# Patient Record
Sex: Female | Born: 1995 | Race: Black or African American | Hispanic: No | Marital: Single | Smoking: Never smoker
Health system: Southern US, Community
[De-identification: ages and names within clinical notes are randomized; demographics above are authoritative.]

## PROBLEM LIST (undated history)

## (undated) DIAGNOSIS — A749 Chlamydial infection, unspecified: Secondary | ICD-10-CM

## (undated) HISTORY — PX: NO PAST SURGERIES: SHX2092

---
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
## 2017-01-24 ENCOUNTER — Encounter (HOSPITAL_COMMUNITY): Payer: Self-pay | Admitting: Certified Nurse Midwife

## 2017-01-24 ENCOUNTER — Inpatient Hospital Stay (HOSPITAL_COMMUNITY)
Admission: AD | Admit: 2017-01-24 | Discharge: 2017-01-24 | Disposition: A | Discharge status: Home / Self Care | Source: Ambulatory Visit | Attending: Obstetrics and Gynecology | Admitting: Obstetrics and Gynecology

## 2017-01-24 DIAGNOSIS — Z3A38 38 weeks gestation of pregnancy: Secondary | ICD-10-CM | POA: Diagnosis not present

## 2017-01-24 DIAGNOSIS — O471 False labor at or after 37 completed weeks of gestation: Secondary | ICD-10-CM | POA: Diagnosis not present

## 2017-01-24 HISTORY — DX: Chlamydial infection, unspecified: A74.9

## 2017-01-24 NOTE — MAU Note (Signed)
Patient presents to MAU with c/o of contractions. States she does not know when contractions began, "did not look at time", states she is having them every 6-7 minutes with a pain of 5/10. Denies LOF, VB. +FM. Closed in office 2 weeks ago.

## 2017-02-06 ENCOUNTER — Encounter (HOSPITAL_COMMUNITY): Payer: Self-pay

## 2017-02-06 ENCOUNTER — Telehealth (HOSPITAL_COMMUNITY): Payer: Self-pay | Admitting: *Deleted

## 2017-02-06 ENCOUNTER — Inpatient Hospital Stay (HOSPITAL_COMMUNITY)
Admission: AD | Admit: 2017-02-06 | Discharge: 2017-02-06 | Disposition: A | Discharge status: Home / Self Care | Source: Ambulatory Visit | Attending: Obstetrics & Gynecology | Admitting: Obstetrics & Gynecology

## 2017-02-06 DIAGNOSIS — Z3A39 39 weeks gestation of pregnancy: Secondary | ICD-10-CM | POA: Diagnosis not present

## 2017-02-06 DIAGNOSIS — R102 Pelvic and perineal pain: Secondary | ICD-10-CM | POA: Diagnosis present

## 2017-02-06 DIAGNOSIS — O471 False labor at or after 37 completed weeks of gestation: Secondary | ICD-10-CM | POA: Diagnosis not present

## 2017-02-06 LAB — POCT FERN TEST: POCT Fern Test: NEGATIVE

## 2017-02-06 NOTE — Telephone Encounter (Signed)
Preadmission screen  

## 2017-02-06 NOTE — Discharge Instructions (Signed)
Braxton Hicks Contractions °Contractions of the uterus can occur throughout pregnancy, but they are not always a sign that you are in labor. You may have practice contractions called Braxton Hicks contractions. These false labor contractions are sometimes confused with true labor. °What are Braxton Hicks contractions? °Braxton Hicks contractions are tightening movements that occur in the muscles of the uterus before labor. Unlike true labor contractions, these contractions do not result in opening (dilation) and thinning of the cervix. Toward the end of pregnancy (32-34 weeks), Braxton Hicks contractions can happen more often and may become stronger. These contractions are sometimes difficult to tell apart from true labor because they can be very uncomfortable. You should not feel embarrassed if you go to the hospital with false labor. °Sometimes, the only way to tell if you are in true labor is for your health care provider to look for changes in the cervix. The health care provider will do a physical exam and may monitor your contractions. If you are not in true labor, the exam should show that your cervix is not dilating and your water has not broken. °If there are no prenatal problems or other health problems associated with your pregnancy, it is completely safe for you to be sent home with false labor. You may continue to have Braxton Hicks contractions until you go into true labor. °How can I tell the difference between true labor and false labor? °· Differences °? False labor °? Contractions last 30-70 seconds.: Contractions are usually shorter and not as strong as true labor contractions. °? Contractions become very regular.: Contractions are usually irregular. °? Discomfort is usually felt in the top of the uterus, and it spreads to the lower abdomen and low back.: Contractions are often felt in the front of the lower abdomen and in the groin. °? Contractions do not go away with walking.: Contractions may  go away when you walk around or change positions while lying down. °? Contractions usually become more intense and increase in frequency.: Contractions get weaker and are shorter-lasting as time goes on. °? The cervix dilates and gets thinner.: The cervix usually does not dilate or become thin. °Follow these instructions at home: °· Take over-the-counter and prescription medicines only as told by your health care provider. °· Keep up with your usual exercises and follow other instructions from your health care provider. °· Eat and drink lightly if you think you are going into labor. °· If Braxton Hicks contractions are making you uncomfortable: °? Change your position from lying down or resting to walking, or change from walking to resting. °? Sit and rest in a tub of warm water. °? Drink enough fluid to keep your urine clear or pale yellow. Dehydration may cause these contractions. °? Do slow and deep breathing several times an hour. °· Keep all follow-up prenatal visits as told by your health care provider. This is important. °Contact a health care provider if: °· You have a fever. °· You have continuous pain in your abdomen. °Get help right away if: °· Your contractions become stronger, more regular, and closer together. °· You have fluid leaking or gushing from your vagina. °· You pass blood-tinged mucus (bloody show). °· You have bleeding from your vagina. °· You have low back pain that you never had before. °· You feel your baby’s head pushing down and causing pelvic pressure. °· Your baby is not moving inside you as much as it used to. °Summary °· Contractions that occur before labor are   called Braxton Hicks contractions, false labor, or practice contractions.  Braxton Hicks contractions are usually shorter, weaker, farther apart, and less regular than true labor contractions. True labor contractions usually become progressively stronger and regular and they become more frequent.  Manage discomfort from  [facility] System[facility] contractions by changing position, resting in a warm bath, drinking plenty of water, or practicing deep breathing. This information is not intended to replace advice given to you by your health care provider. Make sure you discuss any questions you have with your health care provider. Document Released: 06/23/2005 Document Revised: 05/12/2016 Document Reviewed: 05/12/2016 Elsevier Interactive Patient Education  2017 [employer]  Third Trimester of Pregnancy The third trimester is from week 29 through week 42, months 7 through 9. This trimester is when your unborn baby (fetus) is growing very fast. At the end of the ninth month, the unborn baby is about 20 inches in length. It weighs about 6-10 pounds. Follow these instructions at home:  Avoid all smoking, herbs, and alcohol. Avoid drugs not approved by your doctor.  Do not use any tobacco products, including cigarettes, chewing tobacco, and electronic cigarettes. If you need help quitting, ask your doctor. You may get counseling or other support to help you quit.  Only take medicine as told by your doctor. Some medicines are safe and some are not during pregnancy.  Exercise only as told by your doctor. Stop exercising if you start having cramps.  Eat regular, healthy meals.  Wear a good support bra if your breasts are tender.  Do not use hot tubs, steam rooms, or saunas.  Wear your seat belt when driving.  Avoid raw meat, uncooked cheese, and liter boxes and soil used by cats.  Take your prenatal vitamins.  Take 1500-2000 milligrams of calcium daily starting at the 20th week of pregnancy until you deliver your baby.  Try taking medicine that helps you poop (stool softener) as needed, and if your doctor approves. Eat more fiber by eating fresh fruit, vegetables, and whole grains. Drink enough fluids to keep your pee (urine) clear or pale yellow.  Take warm water baths (sitz baths) to soothe pain or discomfort caused  by hemorrhoids. Use hemorrhoid cream if your doctor approves.  If you have puffy, bulging veins (varicose veins), wear support hose. Raise (elevate) your feet for 15 minutes, 3-4 times a day. Limit salt in your diet.  Avoid heavy lifting, wear low heels, and sit up straight.  Rest with your legs raised if you have leg cramps or low back pain.  Visit your dentist if you have not gone during your pregnancy. Use a soft toothbrush to brush your teeth. Be gentle when you floss.  You can have sex (intercourse) unless your doctor tells you not to.  Do not travel far distances unless you must. Only do so with your doctor's approval.  Take prenatal classes.  Practice driving to the hospital.  Pack your hospital bag.  Prepare the baby's room.  Go to your doctor visits. Get help if:  You are not sure if you are in labor or if your water has broken.  You are dizzy.  You have mild cramps or pressure in your lower belly (abdominal).  You have a nagging pain in your belly area.  You continue to feel sick to your stomach (nauseous), throw up (vomit), or have watery poop (diarrhea).  You have bad smelling fluid coming from your vagina.  You have pain with peeing (urination). Get help right away  if:  You have a fever.  You are leaking fluid from your vagina.  You are spotting or bleeding from your vagina.  You have severe belly cramping or pain.  You lose or gain weight rapidly.  You have trouble catching your breath and have chest pain.  You notice sudden or extreme puffiness (swelling) of your face, hands, ankles, feet, or legs.  You have not felt the baby move in over an hour.  You have severe headaches that do not go away with medicine.  You have vision changes. This information is not intended to replace advice given to you by your health care provider. Make sure you discuss any questions you have with your health care provider. Document Released: 09/17/2009 Document  Revised: 11/29/2015 Document Reviewed: 08/24/2012 Elsevier Interactive Patient Education  2017 [employer] Inc.  Fetal Movement Counts Patient Name: ________________________________________________ Patient Due Date: ____________________ What is a fetal movement count? A fetal movement count is the number of times that you feel your baby move during a certain amount of time. This may also be called a fetal kick count. A fetal movement count is recommended for every pregnant woman. You may be asked to start counting fetal movements as early as week 28 of your pregnancy. Pay attention to when your baby is most active. You may notice your baby's sleep and wake cycles. You may also notice things that make your baby move more. You should do a fetal movement count:  When your baby is normally most active.  At the same time each day.  A good time to count movements is while you are resting, after having something to eat and drink. How do I count fetal movements? 1. Find a quiet, comfortable area. Sit, or lie down on your side. 2. Write down the date, the start time and stop time, and the number of movements that you felt between those two times. Take this information with you to your health care visits. 3. For 2 hours, count kicks, flutters, swishes, rolls, and jabs. You should feel at least 10 movements during 2 hours. 4. You may stop counting after you have felt 10 movements. 5. If you do not feel 10 movements in 2 hours, have something to eat and drink. Then, keep resting and counting for 1 hour. If you feel at least 4 movements during that hour, you may stop counting. Contact a health care provider if:  You feel fewer than 4 movements in 2 hours.  Your baby is not moving like he or she usually does. Date: ____________ Start time: ____________ Stop time: ____________ Movements: ____________ Date: ____________ Start time: ____________ Stop time: ____________ Movements: ____________ Date:  ____________ Start time: ____________ Stop time: ____________ Movements: ____________ Date: ____________ Start time: ____________ Stop time: ____________ Movements: ____________ Date: ____________ Start time: ____________ Stop time: ____________ Movements: ____________ Date: ____________ Start time: ____________ Stop time: ____________ Movements: ____________ Date: ____________ Start time: ____________ Stop time: ____________ Movements: ____________ Date: ____________ Start time: ____________ Stop time: ____________ Movements: ____________ Date: ____________ Start time: ____________ Stop time: ____________ Movements: ____________ This information is not intended to replace advice given to you by your health care provider. Make sure you discuss any questions you have with your health care provider. Document Released: 07/23/2006 Document Revised: 02/20/2016 Document Reviewed: 08/02/2015 Elsevier Interactive Patient Education  [employer]2018 [employer]

## 2017-02-06 NOTE — MAU Note (Signed)
Urine in the lab  

## 2017-02-06 NOTE — MAU Note (Signed)
I have communicated with dr Charlotta Newtonozan and reviewed vital signs:  Vitals:   02/06/17 2050 02/06/17 2119  BP: 125/71 115/70  Pulse: 72 74  Resp: 20 18  Temp: 98.5 F (36.9 C)     Vaginal exam:  Dilation: 1.5 Exam by:: Dr Charlotta Newtonzan,   Also reviewed contraction pattern and that non-stress test is reactive.  It has been documented that patient is contracting every 6 minutes to irregular with minimal cervical change since last exam in office Thursday which was 1 cm there, not indicating active labor.  Patient denies any other complaints.  Based on this report provider has given order for discharge.  A discharge order and diagnosis entered by a provider.  Dr Charlotta Newtonzan here and assessed pt.  Labor discharge instructions reviewed with patient.

## 2017-02-06 NOTE — MAU Note (Signed)
Having pain in vagina for 2 days. Worse with walking. Denies vag bleeding. Some brown mucous d/c. Cervix checked Thurs and 1cm

## 2017-02-06 NOTE — H&P (Signed)
HPI: 21 y/o G1P0 @ 651w4d estimated gestational age (as dated by LMP c/w 20 week ultrasound) presents complaining of vaginal pressure.  She does not one episode of leaking fluid, but states she think she had to urinate.  No leakage since that time.  no Vaginal Bleeding,   no Uterine Contractions,  + Fetal Movement.  ROS: no HA, no epigastric pain, no visual changes.    Pregnancy complicated by: 1) Chlamydia, treated, test of cure neg  PNL:  GBS neg, Rub Immune, Hep B neg, RPR NR, HIV neg, GC/C neg, glucola:67 Hgb: 11.2, plt 135 Blood type: A positive, antibody neg  Immunizations: Tdap: 6/1  OBHx: primip PMHx:  non Meds:  PNV Allergy:   Allergies  Allergen Reactions  . Rocephin [Ceftriaxone]    SurgHx: none SocHx:   no Tobacco, no  EtOH, no Illicit Drugs  O: BP 115/70   Pulse 74   Temp 98.5 F (36.9 C)   Resp 18   Ht 5\' 4"  (1.626 m)   Wt 159 lb (72.1 kg)   LMP 05/05/2016   BMI 27.29 kg/m  Gen. AAOx3, NAD CV.  RRR   Resp. Normal respiratory effort Abd. Gravid,  no tenderness,  no rigidity,  no guarding Extr.  no edema B/L , no calf tenderness FHT: 130 baseline, moderate variability, + accels,  no decels Toco: irregular SVE: 1/25/-3  SSE: No vulvar/vaginal/cervical lesions.  Cervix appears about 1cm in dilation, membranes seen.  Negative pooling, negative ferning  Labs: see orders  A/P:  21 y.o. G1P0 @ 2651w4d EGA who presents for false labor -FWB:  NICHD Cat I FHTs -Labor: Minimal cervical dilation noted.  Reviewed labor room precautions and fetal kick counts.  F/U as scheduled in office -GBS: neg  Myna HidalgoJennifer Osmara Drummonds, DO [phone]) [phone] (office)

## 2017-02-10 ENCOUNTER — Encounter (HOSPITAL_COMMUNITY): Payer: Self-pay | Admitting: *Deleted

## 2017-02-10 ENCOUNTER — Inpatient Hospital Stay (HOSPITAL_COMMUNITY)
Admission: AD | Admit: 2017-02-10 | Discharge: 2017-02-10 | Disposition: A | Discharge status: Home / Self Care | Source: Ambulatory Visit | Attending: Obstetrics and Gynecology | Admitting: Obstetrics and Gynecology

## 2017-02-10 DIAGNOSIS — O479 False labor, unspecified: Secondary | ICD-10-CM

## 2017-02-10 NOTE — Discharge Instructions (Signed)
Third Trimester of Pregnancy The third trimester is from week 28 through week 40 (months 7 through 9). The third trimester is a time when the unborn baby (fetus) is growing rapidly. At the end of the ninth month, the fetus is about 20 inches in length and weighs 6-10 pounds. Body changes during your third trimester Your body will continue to go through many changes during pregnancy. The changes vary from woman to woman. During the third trimester:  Your weight will continue to increase. You can expect to gain 25-35 pounds (11-16 kg) by the end of the pregnancy.  You may begin to get stretch marks on your hips, abdomen, and breasts.  You may urinate more often because the fetus is moving lower into your pelvis and pressing on your bladder.  You may develop or continue to have heartburn. This is caused by increased hormones that slow down muscles in the digestive tract.  You may develop or continue to have constipation because increased hormones slow digestion and cause the muscles that push waste through your intestines to relax.  You may develop hemorrhoids. These are swollen veins (varicose veins) in the rectum that can itch or be painful.  You may develop swollen, bulging veins (varicose veins) in your legs.  You may have increased body aches in the pelvis, back, or thighs. This is due to weight gain and increased hormones that are relaxing your joints.  You may have changes in your hair. These can include thickening of your hair, rapid growth, and changes in texture. Some women also have hair loss during or after pregnancy, or hair that feels dry or thin. Your hair will most likely return to normal after your baby is born.  Your breasts will continue to grow and they will continue to become tender. A yellow fluid (colostrum) may leak from your breasts. This is the first milk you are producing for your baby.  Your belly button may stick out.  You may notice more swelling in your hands,  face, or ankles.  You may have increased tingling or numbness in your hands, arms, and legs. The skin on your belly may also feel numb.  You may feel short of breath because of your expanding uterus.  You may have more problems sleeping. This can be caused by the size of your belly, increased need to urinate, and an increase in your body's metabolism.  You may notice the fetus "dropping," or moving lower in your abdomen (lightening).  You may have increased vaginal discharge.  You may notice your joints feel loose and you may have pain around your pelvic bone.  What to expect at prenatal visits You will have prenatal exams every 2 weeks until week 36. Then you will have weekly prenatal exams. During a routine prenatal visit:  You will be weighed to make sure you and the baby are growing normally.  Your blood pressure will be taken.  Your abdomen will be measured to track your baby's growth.  The fetal heartbeat will be listened to.  Any test results from the previous visit will be discussed.  You may have a cervical check near your due date to see if your cervix has softened or thinned (effaced).  You will be tested for Group B streptococcus. This happens between 35 and 37 weeks.  Your health care provider may ask you:  What your birth plan is.  How you are feeling.  If you are feeling the baby move.  If you have had   any abnormal symptoms, such as leaking fluid, bleeding, severe headaches, or abdominal cramping.  If you are using any tobacco products, including cigarettes, chewing tobacco, and electronic cigarettes.  If you have any questions.  Other tests or screenings that may be performed during your third trimester include:  Blood tests that check for low iron levels (anemia).  Fetal testing to check the health, activity level, and growth of the fetus. Testing is done if you have certain medical conditions or if there are problems during the  pregnancy.  Nonstress test (NST). This test checks the health of your baby to make sure there are no signs of problems, such as the baby not getting enough oxygen. During this test, a belt is placed around your belly. The baby is made to move, and its heart rate is monitored during movement.  What is false labor? False labor is a condition in which you feel small, irregular tightenings of the muscles in the womb (contractions) that usually go away with rest, changing position, or drinking water. These are called Braxton Hicks contractions. Contractions may last for hours, days, or even weeks before true labor sets in. If contractions come at regular intervals, become more frequent, increase in intensity, or become painful, you should see your health care provider. What are the signs of labor?  Abdominal cramps.  Regular contractions that start at 10 minutes apart and become stronger and more frequent with time.  Contractions that start on the top of the uterus and spread down to the lower abdomen and back.  Increased pelvic pressure and dull back pain.  A watery or bloody mucus discharge that comes from the vagina.  Leaking of amniotic fluid. This is also known as your "water breaking." It could be a slow trickle or a gush. Let your health care provider know if it has a color or strange odor. If you have any of these signs, call your health care provider right away, even if it is before your due date. Follow these instructions at home: Medicines  Follow your health care provider's instructions regarding medicine use. Specific medicines may be either safe or unsafe to take during pregnancy.  Take a prenatal vitamin that contains at least 600 micrograms (mcg) of folic acid.  If you develop constipation, try taking a stool softener if your health care provider approves. Eating and drinking  Eat a balanced diet that includes fresh fruits and vegetables, whole grains, good sources of protein  such as meat, eggs, or tofu, and low-fat dairy. Your health care provider will help you determine the amount of weight gain that is right for you.  Avoid raw meat and uncooked cheese. These carry germs that can cause birth defects in the baby.  If you have low calcium intake from food, talk to your health care provider about whether you should take a daily calcium supplement.  Eat four or five small meals rather than three large meals a day.  Limit foods that are high in fat and processed sugars, such as fried and sweet foods.  To prevent constipation: ? Drink enough fluid to keep your urine clear or pale yellow. ? Eat foods that are high in fiber, such as fresh fruits and vegetables, whole grains, and beans. Activity  Exercise only as directed by your health care provider. Most women can continue their usual exercise routine during pregnancy. Try to exercise for 30 minutes at least 5 days a week. Stop exercising if you experience uterine contractions.  Avoid heavy   lifting.  Do not exercise in extreme heat or humidity, or at high altitudes.  Wear low-heel, comfortable shoes.  Practice good posture.  You may continue to have sex unless your health care provider tells you otherwise. Relieving pain and discomfort  Take frequent breaks and rest with your legs elevated if you have leg cramps or low back pain.  Take warm sitz baths to soothe any pain or discomfort caused by hemorrhoids. Use hemorrhoid cream if your health care provider approves.  Wear a good support bra to prevent discomfort from breast tenderness.  If you develop varicose veins: ? Wear support pantyhose or compression stockings as told by your healthcare provider. ? Elevate your feet for 15 minutes, 3-4 times a day. Prenatal care  Write down your questions. Take them to your prenatal visits.  Keep all your prenatal visits as told by your health care provider. This is important. Safety  Wear your seat belt at  all times when driving.  Make a list of emergency phone numbers, including numbers for family, friends, the hospital, and police and fire departments. General instructions  Avoid cat litter boxes and soil used by cats. These carry germs that can cause birth defects in the baby. If you have a cat, ask someone to clean the litter box for you.  Do not travel far distances unless it is absolutely necessary and only with the approval of your health care provider.  Do not use hot tubs, steam rooms, or saunas.  Do not drink alcohol.  Do not use any products that contain nicotine or tobacco, such as cigarettes and e-cigarettes. If you need help quitting, ask your health care provider.  Do not use any medicinal herbs or unprescribed drugs. These chemicals affect the formation and growth of the baby.  Do not douche or use tampons or scented sanitary pads.  Do not cross your legs for long periods of time.  To prepare for the arrival of your baby: ? Take prenatal classes to understand, practice, and ask questions about labor and delivery. ? Make a trial run to the hospital. ? Visit the hospital and tour the maternity area. ? Arrange for maternity or paternity leave through employers. ? Arrange for family and friends to take care of pets while you are in the hospital. ? Purchase a rear-facing car seat and make sure you know how to install it in your car. ? Pack your hospital bag. ? Prepare the baby's nursery. Make sure to remove all pillows and stuffed animals from the baby's crib to prevent suffocation.  Visit your dentist if you have not gone during your pregnancy. Use a soft toothbrush to brush your teeth and be gentle when you floss. Contact a health care provider if:  You are unsure if you are in labor or if your water has broken.  You become dizzy.  You have mild pelvic cramps, pelvic pressure, or nagging pain in your abdominal area.  You have lower back pain.  You have persistent  nausea, vomiting, or diarrhea.  You have an unusual or bad smelling vaginal discharge.  You have pain when you urinate. Get help right away if:  Your water breaks before 37 weeks.  You have regular contractions less than 5 minutes apart before 37 weeks.  You have a fever.  You are leaking fluid from your vagina.  You have spotting or bleeding from your vagina.  You have severe abdominal pain or cramping.  You have rapid weight loss or weight gain.    You have shortness of breath with chest pain.  You notice sudden or extreme swelling of your face, hands, ankles, feet, or legs.  Your baby makes fewer than 10 movements in 2 hours.  You have severe headaches that do not go away when you take medicine.  You have vision changes. Summary  The third trimester is from week 28 through week 40, months 7 through 9. The third trimester is a time when the unborn baby (fetus) is growing rapidly.  During the third trimester, your discomfort may increase as you and your baby continue to gain weight. You may have abdominal, leg, and back pain, sleeping problems, and an increased need to urinate.  During the third trimester your breasts will keep growing and they will continue to become tender. A yellow fluid (colostrum) may leak from your breasts. This is the first milk you are producing for your baby.  False labor is a condition in which you feel small, irregular tightenings of the muscles in the womb (contractions) that eventually go away. These are called Braxton Hicks contractions. Contractions may last for hours, days, or even weeks before true labor sets in.  Signs of labor can include: abdominal cramps; regular contractions that start at 10 minutes apart and become stronger and more frequent with time; watery or bloody mucus discharge that comes from the vagina; increased pelvic pressure and dull back pain; and leaking of amniotic fluid. This information is not intended to replace advice  given to you by your health care provider. Make sure you discuss any questions you have with your health care provider. Document Released: 06/17/2001 Document Revised: 11/29/2015 Document Reviewed: 08/24/2012 Elsevier Interactive Patient Education  2017 [employer] Fetal Movement Counts Patient Name: ________________________________________________ Patient Due Date: ____________________ What is a fetal movement count? A fetal movement count is the number of times that you feel your baby move during a certain amount of time. This may also be called a fetal kick count. A fetal movement count is recommended for every pregnant woman. You may be asked to start counting fetal movements as early as week 28 of your pregnancy. Pay attention to when your baby is most active. You may notice your baby's sleep and wake cycles. You may also notice things that make your baby move more. You should do a fetal movement count:  When your baby is normally most active.  At the same time each day.  A good time to count movements is while you are resting, after having something to eat and drink. How do I count fetal movements? 1. Find a quiet, comfortable area. Sit, or lie down on your side. 2. Write down the date, the start time and stop time, and the number of movements that you felt between those two times. Take this information with you to your health care visits. 3. For 2 hours, count kicks, flutters, swishes, rolls, and jabs. You should feel at least 10 movements during 2 hours. 4. You may stop counting after you have felt 10 movements. 5. If you do not feel 10 movements in 2 hours, have something to eat and drink. Then, keep resting and counting for 1 hour. If you feel at least 4 movements during that hour, you may stop counting. Contact a health care provider if:  You feel fewer than 4 movements in 2 hours.  Your baby is not moving like he or she usually does. Date: ____________ Start time: ____________  Stop time: ____________ Movements: ____________ Date: ____________ Start time:   ____________ Stop time: ____________ Movements: ____________ Date: ____________ Start time: ____________ Stop time: ____________ Movements: ____________ Date: ____________ Start time: ____________ Stop time: ____________ Movements: ____________ Date: ____________ Start time: ____________ Stop time: ____________ Movements: ____________ Date: ____________ Start time: ____________ Stop time: ____________ Movements: ____________ Date: ____________ Start time: ____________ Stop time: ____________ Movements: ____________ Date: ____________ Start time: ____________ Stop time: ____________ Movements: ____________ Date: ____________ Start time: ____________ Stop time: ____________ Movements: ____________ This information is not intended to replace advice given to you by your health care provider. Make sure you discuss any questions you have with your health care provider. Document Released: 07/23/2006 Document Revised: 02/20/2016 Document Reviewed: 08/02/2015 Elsevier Interactive Patient Education  2018 [employer] Inc[employer]  

## 2017-02-10 NOTE — MAU Note (Signed)
Patient reports vaginal pain Constant Rating pain 6-7/10 States was seen a couple days ago for the same pain  +braxhton hicks contractions at times per patient  +constipation States able to go but that it hurts and isnt a lot  Denies LOF or VB  +FM

## 2017-02-10 NOTE — MAU Note (Signed)
I have communicated with Dr Richardson Doppole and reviewed vital signs:  Vitals:   02/10/17 1026  BP: 135/80  Pulse: 81  Resp: 18  Temp: (!) 97.5 F (36.4 C)    Vaginal exam:  Dilation: 1.5 Effacement (%): 50 Station: -3 Presentation: Undeterminable Exam by:: Taelor Moncada rn,   Also reviewed contraction pattern and that non-stress test is reactive.  It has been documented that patient is having irregular contractions not indicating active labor.  Patient denies any other complaints.  Based on this report provider has given order for discharge.  A discharge order and diagnosis entered by a provider.   Labor discharge instructions reviewed with patient.

## 2017-02-13 ENCOUNTER — Inpatient Hospital Stay (HOSPITAL_COMMUNITY): Admitting: Anesthesiology

## 2017-02-13 ENCOUNTER — Encounter (HOSPITAL_COMMUNITY): Payer: Self-pay | Admitting: *Deleted

## 2017-02-13 ENCOUNTER — Inpatient Hospital Stay (HOSPITAL_COMMUNITY)
Admission: AD | Admit: 2017-02-13 | Discharge: 2017-02-16 | DRG: 775 | Disposition: A | Discharge status: Home / Self Care | Source: Ambulatory Visit | Attending: Obstetrics & Gynecology | Admitting: Obstetrics & Gynecology

## 2017-02-13 DIAGNOSIS — Z3A40 40 weeks gestation of pregnancy: Secondary | ICD-10-CM | POA: Diagnosis not present

## 2017-02-13 DIAGNOSIS — Z3493 Encounter for supervision of normal pregnancy, unspecified, third trimester: Secondary | ICD-10-CM | POA: Diagnosis present

## 2017-02-13 DIAGNOSIS — D696 Thrombocytopenia, unspecified: Secondary | ICD-10-CM | POA: Diagnosis present

## 2017-02-13 DIAGNOSIS — O9912 Other diseases of the blood and blood-forming organs and certain disorders involving the immune mechanism complicating childbirth: Principal | ICD-10-CM | POA: Diagnosis present

## 2017-02-13 LAB — CBC
HEMATOCRIT: 32.3 % — AB (ref 36.0–46.0)
HEMOGLOBIN: 10.9 g/dL — AB (ref 12.0–15.0)
MCH: 29.1 pg (ref 26.0–34.0)
MCHC: 33.7 g/dL (ref 30.0–36.0)
MCV: 86.1 fL (ref 78.0–100.0)
Platelets: 132 10*3/uL — ABNORMAL LOW (ref 150–400)
RBC: 3.75 MIL/uL — ABNORMAL LOW (ref 3.87–5.11)
RDW: 15 % (ref 11.5–15.5)
WBC: 12.7 10*3/uL — AB (ref 4.0–10.5)

## 2017-02-13 LAB — COMPREHENSIVE METABOLIC PANEL
ALT: 12 U/L — ABNORMAL LOW (ref 14–54)
AST: 17 U/L (ref 15–41)
Albumin: 3.3 g/dL — ABNORMAL LOW (ref 3.5–5.0)
Alkaline Phosphatase: 162 U/L — ABNORMAL HIGH (ref 38–126)
Anion gap: 10 (ref 5–15)
BUN: 7 mg/dL (ref 6–20)
CHLORIDE: 105 mmol/L (ref 101–111)
CO2: 19 mmol/L — ABNORMAL LOW (ref 22–32)
CREATININE: 0.54 mg/dL (ref 0.44–1.00)
Calcium: 9.4 mg/dL (ref 8.9–10.3)
Glucose, Bld: 75 mg/dL (ref 65–99)
POTASSIUM: 4 mmol/L (ref 3.5–5.1)
Sodium: 134 mmol/L — ABNORMAL LOW (ref 135–145)
Total Bilirubin: 0.6 mg/dL (ref 0.3–1.2)
Total Protein: 7 g/dL (ref 6.5–8.1)

## 2017-02-13 LAB — PROTEIN / CREATININE RATIO, URINE
CREATININE, URINE: 139 mg/dL
Protein Creatinine Ratio: 0.21 mg/mg{Cre} — ABNORMAL HIGH (ref 0.00–0.15)
TOTAL PROTEIN, URINE: 29 mg/dL

## 2017-02-13 LAB — TYPE AND SCREEN
ABO/RH(D): A POS
ANTIBODY SCREEN: NEGATIVE

## 2017-02-13 LAB — ABO/RH: ABO/RH(D): A POS

## 2017-02-13 LAB — POCT FERN TEST: POCT Fern Test: POSITIVE

## 2017-02-13 MED ORDER — LACTATED RINGERS IV SOLN
INTRAVENOUS | Status: DC
  Administered 2017-02-13 (×2): via INTRAVENOUS

## 2017-02-13 MED ORDER — OXYTOCIN BOLUS FROM INFUSION
500.0000 mL | Freq: Once | INTRAVENOUS | Status: AC
  Administered 2017-02-14: 500 mL via INTRAVENOUS

## 2017-02-13 MED ORDER — BUTORPHANOL TARTRATE 1 MG/ML IJ SOLN: 1.0000 mg | INTRAMUSCULAR | Status: DC | PRN

## 2017-02-13 MED ORDER — LIDOCAINE HCL (PF) 1 % IJ SOLN
INTRAMUSCULAR | Status: DC | PRN
  Administered 2017-02-13 (×2): 4 mL via EPIDURAL

## 2017-02-13 MED ORDER — PHENYLEPHRINE 40 MCG/ML (10ML) SYRINGE FOR IV PUSH (FOR BLOOD PRESSURE SUPPORT)
80.0000 ug | PREFILLED_SYRINGE | INTRAVENOUS | Status: DC | PRN
  Filled 2017-02-13: qty 5
  Filled 2017-02-13: qty 10

## 2017-02-13 MED ORDER — LACTATED RINGERS IV SOLN
500.0000 mL | INTRAVENOUS | Status: DC | PRN
  Administered 2017-02-13 (×2): 500 mL via INTRAVENOUS

## 2017-02-13 MED ORDER — EPHEDRINE 5 MG/ML INJ
10.0000 mg | INTRAVENOUS | Status: DC | PRN
  Filled 2017-02-13: qty 2

## 2017-02-13 MED ORDER — FENTANYL 2.5 MCG/ML BUPIVACAINE 1/10 % EPIDURAL INFUSION (WH - ANES)
14.0000 mL/h | INTRAMUSCULAR | Status: DC | PRN
  Administered 2017-02-13 – 2017-02-14 (×2): 14 mL/h via EPIDURAL
  Filled 2017-02-13 (×2): qty 100

## 2017-02-13 MED ORDER — OXYCODONE-ACETAMINOPHEN 5-325 MG PO TABS: 1.0000 | ORAL_TABLET | ORAL | Status: DC | PRN

## 2017-02-13 MED ORDER — OXYTOCIN 40 UNITS IN LACTATED RINGERS INFUSION - SIMPLE MED
2.5000 [IU]/h | INTRAVENOUS | Status: DC
Start: 2017-02-13 — End: 2017-02-14

## 2017-02-13 MED ORDER — LIDOCAINE HCL (PF) 1 % IJ SOLN
30.0000 mL | INTRAMUSCULAR | Status: DC | PRN
  Filled 2017-02-13: qty 30

## 2017-02-13 MED ORDER — DIPHENHYDRAMINE HCL 50 MG/ML IJ SOLN: 12.5000 mg | INTRAMUSCULAR | Status: DC | PRN

## 2017-02-13 MED ORDER — LACTATED RINGERS IV SOLN
500.0000 mL | Freq: Once | INTRAVENOUS | Status: AC
  Administered 2017-02-13: 500 mL via INTRAVENOUS

## 2017-02-13 MED ORDER — ONDANSETRON HCL 4 MG/2ML IJ SOLN: 4.0000 mg | Freq: Four times a day (QID) | INTRAMUSCULAR | Status: DC | PRN

## 2017-02-13 MED ORDER — OXYTOCIN 40 UNITS IN LACTATED RINGERS INFUSION - SIMPLE MED
1.0000 m[IU]/min | INTRAVENOUS | Status: DC
  Administered 2017-02-13: 2 m[IU]/min via INTRAVENOUS
  Filled 2017-02-13: qty 1000

## 2017-02-13 MED ORDER — ACETAMINOPHEN 325 MG PO TABS: 650.0000 mg | ORAL_TABLET | ORAL | Status: DC | PRN

## 2017-02-13 MED ORDER — TERBUTALINE SULFATE 1 MG/ML IJ SOLN
0.2500 mg | Freq: Once | INTRAMUSCULAR | Status: DC | PRN
  Filled 2017-02-13: qty 1

## 2017-02-13 MED ORDER — PHENYLEPHRINE 40 MCG/ML (10ML) SYRINGE FOR IV PUSH (FOR BLOOD PRESSURE SUPPORT)
80.0000 ug | PREFILLED_SYRINGE | INTRAVENOUS | Status: DC | PRN
  Filled 2017-02-13: qty 5

## 2017-02-13 MED ORDER — SOD CITRATE-CITRIC ACID 500-334 MG/5ML PO SOLN: 30.0000 mL | ORAL | Status: DC | PRN

## 2017-02-13 MED ORDER — OXYCODONE-ACETAMINOPHEN 5-325 MG PO TABS: 2.0000 | ORAL_TABLET | ORAL | Status: DC | PRN

## 2017-02-13 NOTE — H&P (Signed)
21 y/o G1P0 @ 592w0d estimated gestational age (as dated by LMP c/w 20 week ultrasound) presents for scheduled IOL for full term pregnancy.  Some irregular contractions, no VB, no LOF, +FM  [facility] Dr. Charlotta Newtonzan.  ROS: no HA, no epigastric pain, no visual changes.    Pregnancy complicated by: 1) Chlamydia, treated, test of cure neg  PNL:  GBS neg, Rub Immune, Hep B neg, RPR NR, HIV neg, GC/C neg, glucola:67 Hgb: 11.2, plt 135 Blood type: A positive, antibody neg  Immunizations: Tdap: 6/1  OBHx: primip PMHx:  non Meds:  PNV Allergy:       Allergies  Allergen Reactions  . Rocephin [Ceftriaxone]    SurgHx: none SocHx:   no Tobacco, no  EtOH, no Illicit Drugs  O: BP 115/70   Pulse 74   Temp 98.5 F (36.9 C)   Resp 18   Ht 5\' 4"  (1.626 m)   Wt 159 lb (72.1 kg)   LMP 05/05/2016   BMI 27.29 kg/m   Gen. AAOx3, NAD CV.  RRR   Resp. Normal respiratory effort Abd. Gravid,  no tenderness,  no rigidity,  no guarding Extr.  no edema B/L , no calf tenderness  FHT: 130 by doppler SVE: 1-2/25/-3, vertex on exam   Labs: see orders  A/P:  21 y.o. G1P0 @ 722w0d EGA who presents for IOL for full term pregnancy -FWB:  Reassuring by doppler -Labor: plan for Pit per protocol -GBS: neg -Pain management: IV or epidural upon request   Myna HidalgoJennifer Naydelin Ziegler, DO [phone] (pager) [phone] (office)

## 2017-02-13 NOTE — Anesthesia Pain Management Evaluation Note (Signed)
  CRNA Pain Management Visit Note  Patient: Sydney Adams, 21 y.o., female  "Hello I am a [facility] of the anesthesia team at [facility]. We have an anesthesia team available at all times to provide care throughout the hospital, including epidural management and anesthesia for C-section. I don't know your plan for the delivery whether it a natural birth, water birth, IV sedation, nitrous supplementation, doula or epidural, but we want to meet your pain goals."   1.Was your pain managed to your expectations on prior hospitalizations?   No prior hospitalizations  2.What is your expectation for pain management during this hospitalization?     Epidural  3.How can we help you reach that goal? Epidural in place and working well.  Record the patient's initial score and the patient's pain goal.   Pain: 0  Pain Goal: 5 [facility] wants you to be able to say your pain was always managed very well.  Saheed Carrington 02/13/2017

## 2017-02-13 NOTE — Anesthesia Procedure Notes (Signed)
Epidural Patient location during procedure: OB Start time: 02/13/2017 5:00 PM  Staffing Anesthesiologist: Mal AmabileFOSTER, Delando Satter  Preanesthetic Checklist Completed: patient identified, site marked, surgical consent, pre-op evaluation, timeout performed, IV checked, risks and benefits discussed and monitors and equipment checked  Epidural Patient position: sitting Prep: site prepped and draped and DuraPrep Patient monitoring: continuous pulse ox and blood pressure Approach: midline Location: L3-L4 Injection technique: LOR air  Needle:  Needle type: Tuohy  Needle gauge: 17 G Needle length: 9 cm and 9 Needle insertion depth: 4 cm Catheter type: closed end flexible Catheter size: 19 Gauge Catheter at skin depth: 9 cm Test dose: negative and Other  Assessment Events: blood not aspirated, injection not painful, no injection resistance, negative IV test and no paresthesia  Additional Notes Patient identified. Risks and benefits discussed including failed block, incomplete  Pain control, post dural puncture headache, nerve damage, paralysis, blood pressure Changes, nausea, vomiting, reactions to medications-both toxic and allergic and post Partum back pain. All questions were answered. Patient expressed understanding and wished to proceed. Sterile technique was used throughout procedure. Epidural site was Dressed with sterile barrier dressing. No paresthesias, signs of intravascular injection Or signs of intrathecal spread were encountered.  Patient was more comfortable after the epidural was dosed. Please see RN's note for documentation of vital signs and FHR which are stable.

## 2017-02-13 NOTE — H&P (Signed)
21y/o G1P0@40w4dd  estimated gestational age (as dated by LMP c/w 20week ultrasound) presents for painful contractions and rupture of fluid around 11:30am.  No VB, +FM  [facility]. Charlotta Newtonzan.  ROS: noHA, noepigastric pain, novisual changes.   Pregnancy complicated by: 1) Chlamydia, treated, test of cure neg  PNL: GBS neg, Rub Immune, Hep B neg, RPR NR, HIV neg, GC/C neg, glucola:67 Hgb: 11.2, plt 135 Blood type: A positive, antibody neg  Immunizations: Tdap: 6/1  OBHx:primip PMHx: non Meds: PNV Allergy:      Allergies  Allergen Reactions  . Rocephin [Ceftriaxone]    SurgHx:none SocHx: noTobacco, noEtOH, noIllicit Drugs  O: BP 115/70  Pulse 74  Temp 98.5 F (36.9 C)  Resp 18  Ht 5\' 4"  (1.626 m)  Wt 159 lb (72.1 kg)  LMP 05/05/2016  BMI 27.29 kg/m   Gen. AAOx3, NAD CV. RRR  Resp. Normal respiratory effort Abd.Gravid, notenderness, norigidity, noguarding Extr. no edema B/L , no calf tenderness  FHT: 130, moderate variability, + accels, no decels Toco: irregular SVE: 4/80/-2, vertex on exam  Labs:see orders  A/P: 21 y.o.G1P0@ 3424w4d EGA who presents for labor -FWB:Cat. I -Labor:will consider Pitocin if contractions not regular or no further cervical dilation -GBS: neg -Pain management: continue with epidural  Dr. Dion BodyVarnado to assume care  Myna HidalgoJennifer Davis Ambrosini, DO [phone] (pager) [phone] (office)

## 2017-02-13 NOTE — Progress Notes (Addendum)
Sherrilyn RistKetara Sindt is a 21 y.o. G1P0 at 1374w4d   Subjective: Pt comfortable s/p epidural.  Objective: BP 119/69   Pulse 68   Temp 98.6 F (37 C) (Oral)   Resp 18   Ht 5\' 4"  (1.626 m)   Wt 72.1 kg (159 lb)   LMP 05/05/2016   SpO2 100%   BMI 27.29 kg/m  No intake/output data recorded. No intake/output data recorded.  FHT:  FHR: 130s bpm, variability: moderate,  accelerations:  Present,  decelerations:  Present variable UC:   regular, every 4-5 minutes SVE:   Dilation: 4 Effacement (%): 80 Station: -2 Exam by:: ozan md CVX deferred.  Labs: Lab Results  Component Value Date   WBC 12.7 (H) 02/13/2017   HGB 10.9 (L) 02/13/2017   HCT 32.3 (L) 02/13/2017   MCV 86.1 02/13/2017   PLT 132 (L) 02/13/2017    Assessment / Plan: IUP @ 40 4/7 weeks SROM x 8 hours. Mild Thrombocytopenia.  Labor: Slow dilitation.  If no change, recommend Pitocin augmentation due to SROM. Preeclampsia:  BP normal.  Check CMP to rule out HELLP. Fetal Wellbeing:  Category II overall reassuring Pain Control:  Epidural I/D:  No signs of chorioamnionitis.  Consider antibiotics if not delivered by 18 hours post ROM. Anticipated MOD:  NSVD   Addendum:  CMP done @ 1317.  AST/ALT normal.  Jenetta Wease 02/13/2017, 7:38 PM

## 2017-02-13 NOTE — Anesthesia Preprocedure Evaluation (Signed)
Anesthesia Evaluation  Patient identified by MRN, date of birth, ID band Patient awake    Reviewed: Allergy & Precautions, H&P , Patient's Chart, lab work & pertinent test results  Airway Mallampati: II  TM Distance: >3 FB Neck ROM: full    Dental no notable dental hx. (+) Teeth Intact   Pulmonary neg pulmonary ROS,    Pulmonary exam normal breath sounds clear to auscultation       Cardiovascular negative cardio ROS Normal cardiovascular exam Rhythm:regular Rate:Normal     Neuro/Psych negative neurological ROS  negative psych ROS   GI/Hepatic negative GI ROS, Neg liver ROS,   Endo/Other  negative endocrine ROS  Renal/GU negative Renal ROS  negative genitourinary   Musculoskeletal   Abdominal   Peds  Hematology negative hematology ROS (+)   Anesthesia Other Findings   Reproductive/Obstetrics (+) Pregnancy                             Anesthesia Physical Anesthesia Plan  ASA: II  Anesthesia Plan: Epidural   Post-op Pain Management:    Induction:   PONV Risk Score and Plan:   Airway Management Planned:   Additional Equipment:   Intra-op Plan:   Post-operative Plan:   Informed Consent: I have reviewed the patients History and Physical, chart, labs and discussed the procedure including the risks, benefits and alternatives for the proposed anesthesia with the patient or authorized representative who has indicated his/her understanding and acceptance.     Plan Discussed with: Anesthesiologist  Anesthesia Plan Comments:         Anesthesia Quick Evaluation  

## 2017-02-13 NOTE — Progress Notes (Signed)
Notified by RN of repetative variable decelerations.  Cervix changed from 4 to 9.5 cm.  RN had adjusted position and started IV fluid bolus.  Pitocin was initially decreased from 2 millinunits to 1 milliunit. Tracing improved.  Variables resumed.  Order given to discontinue Pitocin.  Pt laid in left lateral decubitus with peanut.  Tracing was reassuring upon arrival.  Contractions were not tracing well at this time.  Plan to recheck cervix in approximately 1 hour.  Events d/w family.

## 2017-02-14 ENCOUNTER — Encounter (HOSPITAL_COMMUNITY): Payer: Self-pay | Admitting: *Deleted

## 2017-02-14 LAB — CBC
HCT: 29.8 % — ABNORMAL LOW (ref 36.0–46.0)
Hemoglobin: 10.1 g/dL — ABNORMAL LOW (ref 12.0–15.0)
MCH: 29.2 pg (ref 26.0–34.0)
MCHC: 33.9 g/dL (ref 30.0–36.0)
MCV: 86.1 fL (ref 78.0–100.0)
PLATELETS: 132 10*3/uL — AB (ref 150–400)
RBC: 3.46 MIL/uL — AB (ref 3.87–5.11)
RDW: 15.1 % (ref 11.5–15.5)
WBC: 15.9 10*3/uL — AB (ref 4.0–10.5)

## 2017-02-14 LAB — RPR: RPR: NONREACTIVE

## 2017-02-14 MED ORDER — OXYCODONE-ACETAMINOPHEN 5-325 MG PO TABS: 2.0000 | ORAL_TABLET | ORAL | Status: DC | PRN

## 2017-02-14 MED ORDER — METHYLERGONOVINE MALEATE 0.2 MG/ML IJ SOLN: 0.2000 mg | INTRAMUSCULAR | Status: DC | PRN

## 2017-02-14 MED ORDER — SENNOSIDES-DOCUSATE SODIUM 8.6-50 MG PO TABS
2.0000 | ORAL_TABLET | ORAL | Status: DC
  Administered 2017-02-14 – 2017-02-16 (×2): 2 via ORAL
  Filled 2017-02-14 (×2): qty 2

## 2017-02-14 MED ORDER — MAGNESIUM HYDROXIDE 400 MG/5ML PO SUSP: 30.0000 mL | ORAL | Status: DC | PRN

## 2017-02-14 MED ORDER — ONDANSETRON HCL 4 MG/2ML IJ SOLN: 4.0000 mg | INTRAMUSCULAR | Status: DC | PRN

## 2017-02-14 MED ORDER — OXYCODONE-ACETAMINOPHEN 5-325 MG PO TABS: 1.0000 | ORAL_TABLET | ORAL | Status: DC | PRN

## 2017-02-14 MED ORDER — OXYTOCIN 40 UNITS IN LACTATED RINGERS INFUSION - SIMPLE MED: 2.5000 [IU]/h | INTRAVENOUS | Status: DC | PRN

## 2017-02-14 MED ORDER — WITCH HAZEL-GLYCERIN EX PADS: 1.0000 "application " | MEDICATED_PAD | CUTANEOUS | Status: DC | PRN

## 2017-02-14 MED ORDER — ACETAMINOPHEN 325 MG PO TABS: 650.0000 mg | ORAL_TABLET | ORAL | Status: DC | PRN

## 2017-02-14 MED ORDER — SIMETHICONE 80 MG PO CHEW: 80.0000 mg | CHEWABLE_TABLET | ORAL | Status: DC | PRN

## 2017-02-14 MED ORDER — COCONUT OIL OIL
1.0000 "application " | TOPICAL_OIL | Status: DC | PRN
  Filled 2017-02-14: qty 120

## 2017-02-14 MED ORDER — TETANUS-DIPHTH-ACELL PERTUSSIS 5-2.5-18.5 LF-MCG/0.5 IM SUSP
0.5000 mL | Freq: Once | INTRAMUSCULAR | Status: AC
  Administered 2017-02-15: 0.5 mL via INTRAMUSCULAR

## 2017-02-14 MED ORDER — PRENATAL MULTIVITAMIN CH
1.0000 | ORAL_TABLET | Freq: Every day | ORAL | Status: DC
  Administered 2017-02-14 – 2017-02-16 (×3): 1 via ORAL
  Filled 2017-02-14 (×4): qty 1

## 2017-02-14 MED ORDER — ONDANSETRON HCL 4 MG PO TABS: 4.0000 mg | ORAL_TABLET | ORAL | Status: DC | PRN

## 2017-02-14 MED ORDER — ZOLPIDEM TARTRATE 5 MG PO TABS: 5.0000 mg | ORAL_TABLET | Freq: Every evening | ORAL | Status: DC | PRN

## 2017-02-14 MED ORDER — IBUPROFEN 600 MG PO TABS
600.0000 mg | ORAL_TABLET | Freq: Four times a day (QID) | ORAL | Status: DC
  Administered 2017-02-14 – 2017-02-16 (×10): 600 mg via ORAL
  Filled 2017-02-14 (×10): qty 1

## 2017-02-14 MED ORDER — BENZOCAINE-MENTHOL 20-0.5 % EX AERO
1.0000 "application " | INHALATION_SPRAY | CUTANEOUS | Status: DC | PRN
  Filled 2017-02-14: qty 56

## 2017-02-14 MED ORDER — METHYLERGONOVINE MALEATE 0.2 MG PO TABS: 0.2000 mg | ORAL_TABLET | ORAL | Status: DC | PRN

## 2017-02-14 MED ORDER — DIBUCAINE 1 % RE OINT: 1.0000 "application " | TOPICAL_OINTMENT | RECTAL | Status: DC | PRN

## 2017-02-14 MED ORDER — FERROUS SULFATE 325 (65 FE) MG PO TABS
325.0000 mg | ORAL_TABLET | Freq: Two times a day (BID) | ORAL | Status: DC
  Administered 2017-02-14 – 2017-02-16 (×5): 325 mg via ORAL
  Filled 2017-02-14 (×5): qty 1

## 2017-02-14 MED ORDER — DIPHENHYDRAMINE HCL 25 MG PO CAPS: 25.0000 mg | ORAL_CAPSULE | Freq: Four times a day (QID) | ORAL | Status: DC | PRN

## 2017-02-14 NOTE — Anesthesia Postprocedure Evaluation (Signed)
Anesthesia Post Note  Patient: Multimedia programmerKetara Adams  Procedure(s) Performed: * No procedures listed *     Patient location during evaluation: Mother Baby Anesthesia Type: Epidural Level of consciousness: awake Pain management: satisfactory to patient Vital Signs Assessment: post-procedure vital signs reviewed and stable Respiratory status: spontaneous breathing Cardiovascular status: stable Anesthetic complications: no    Last Vitals:  Vitals:   02/14/17 0434 02/14/17 0515  BP: 118/66 118/60  Pulse: 72 74  Resp: 16 18  Temp:    SpO2:      Last Pain:  Vitals:   02/14/17 0516  TempSrc:   PainSc: 0-No pain   Pain Goal: Patients Stated Pain Goal: 0 (02/13/17 1542)               Cephus ShellingBURGER,[name]

## 2017-02-14 NOTE — Lactation Note (Signed)
This note was copied from a baby's chart. Lactation Consultation Note  Patient Name: Sydney Adams WJXBJ'YToday's Date: 02/14/2017 Reason for consult: Initial assessment;Term;Primapara;1st time breastfeeding Breastfeeding consultation services and support information given to patient.  This is mom's first baby and newborn is 7313 hours old.  Baby has been to the breast 6 times and mom denies any difficulty with latch.  Instructed to feed with any feeding cue and call for assist prn.  Maternal Data    Feeding    LATCH Score                   Interventions    Lactation Tools Discussed/Used     Consult Status Consult Status: Follow-up Date: 02/15/17 Follow-up type: In-patient    Huston FoleyMOULDEN, Raghav Verrilli S 02/14/2017, 3:13 PM

## 2017-02-15 MED ORDER — FERROUS SULFATE 325 (65 FE) MG PO TABS: 325.0000 mg | ORAL_TABLET | Freq: Every day | ORAL | 3 refills | Status: DC

## 2017-02-15 MED ORDER — IBUPROFEN 600 MG PO TABS: 600.0000 mg | ORAL_TABLET | Freq: Four times a day (QID) | ORAL | 0 refills | Status: DC

## 2017-02-15 NOTE — Progress Notes (Signed)
Postpartum day #1, NSVD  Subjective Pt without complaints.  Lochia normal.  Pain controlled.  Breast feeding yes  Temp:  [97.9 F (36.6 C)-98.5 F (36.9 C)] 97.9 F (36.6 C) (08/12 0557) Pulse Rate:  [64-90] 64 (08/12 0557) Resp:  [16-20] 16 (08/12 0557) BP: (121-131)/(65-83) 131/83 (08/12 0557)  Gen:  NAD, A&O x 3 Uterine fundus:  Firm, nontender Lochia normal Ext:  +Edema, no calf tenderness bilaterally  CBC    Component Value Date/Time   WBC 15.9 (H) 02/14/2017 0548   RBC 3.46 (L) 02/14/2017 0548   HGB 10.1 (L) 02/14/2017 0548   HCT 29.8 (L) 02/14/2017 0548   PLT 132 (L) 02/14/2017 0548   MCV 86.1 02/14/2017 0548   MCH 29.2 02/14/2017 0548   MCHC 33.9 02/14/2017 0548   RDW 15.1 02/14/2017 0548     A/P: S/p SVD doing well. Routine postpartum care. Lactation support. Discharge in am.  Geryl RankinsVARNADO, Annaleese Guier 02/15/2017, 2:18 PM

## 2017-02-15 NOTE — Discharge Instructions (Signed)

## 2017-02-15 NOTE — Progress Notes (Signed)
Postpartum day #1, NSVD  Subjective Pt without complaints.  Lochia normal.  Pain controlled.  Breast feeding Yes but pain with nursing.  Has bottle fed.  Temp:  [97.9 F (36.6 C)-98.5 F (36.9 C)] 97.9 F (36.6 C) (08/12 0557) Pulse Rate:  [64-90] 64 (08/12 0557) Resp:  [16-20] 16 (08/12 0557) BP: (121-131)/(65-83) 131/83 (08/12 0557)  Gen:  NAD, A&O x 3 Uterine fundus:  Firm, nontender Lochia normal Ext:  Minimal Edema, no calf tenderness bilaterally  CBC    Component Value Date/Time   WBC 15.9 (H) 02/14/2017 0548   RBC 3.46 (L) 02/14/2017 0548   HGB 10.1 (L) 02/14/2017 0548   HCT 29.8 (L) 02/14/2017 0548   PLT 132 (L) 02/14/2017 0548   MCV 86.1 02/14/2017 0548   MCH 29.2 02/14/2017 0548   MCHC 33.9 02/14/2017 0548   RDW 15.1 02/14/2017 0548     A/P: S/p SVD doing well. Routine postpartum care. Lactation support. Discharge in am  Geryl RankinsVARNADO, Blaise Grieshaber 02/15/2017, 2:49 PM

## 2017-02-16 ENCOUNTER — Inpatient Hospital Stay (HOSPITAL_COMMUNITY)
Admission: RE | Admit: 2017-02-16 | Discharge: 2017-02-16 | Disposition: A | Discharge status: Home / Self Care | Source: Ambulatory Visit | Attending: Obstetrics & Gynecology | Admitting: Obstetrics & Gynecology

## 2017-02-16 NOTE — Lactation Note (Signed)
This note was copied from a baby's chart. Lactation Consultation Note  Patient Name: Sydney Adams ZOXWR'UToday's Date: 02/16/2017 Reason for consult: Follow-up assessment;Infant weight loss (3% weight loss, milk is in bilaterally )  Baby is 6358 hours old,  And has been breast / formula  Per mom baby last fed at 1000 for 60 mins.  Per mom the left nipple sore - LC assessed breast tissue  with moms permission - LC noted cracking on the left nipple.  LC reviewed hand expressing, breast full bilaterally , no engorged, but as lateral nodules.  LC recommended to mom to use her EBM to her nipples , so easily expressed.  LC instructed  Mom on the use hand pump, shells, comfort gels, sore nipple and engorgement prevention and reverse pressure.  LC also recommended to mom since her milk is in always soften the 1st breast well prior to offering the 2nd breast and  If the baby only feeds the 1st breast , release 2nd breast down with hand pump to prevent engorgement.  LC also discussed supply and demand and the importance of stimulation to the breast consistently at least 8 times a day ' To protect and establish milk supply.  Mom receptive to teaching.  Per grandmother - called the insurance company and they will provide a DEBP.  Mother informed of post-discharge support and given phone number to the lactation department, including services for phone call assistance; out-patient appointments; and breastfeeding support group. List of other breastfeeding resources in the community given in the handout. Encouraged mother to call for problems or concerns related to breastfeeding.   Maternal Data Has patient been taught Hand Expression?: Yes Does the patient have breastfeeding experience prior to this delivery?: No  Feeding Feeding Type: Breast Fed Length of feed: 60 min (per mom )  LATCH Score                   Interventions Interventions: Breast feeding basics reviewed;Shells;Comfort  gels;Hand pump  Lactation Tools Discussed/Used Tools: Shells;Comfort gels;Pump Shell Type: Inverted Breast pump type: Manual WIC Program: Yes (per mom ) Pump Review: Setup, frequency, and cleaning Initiated by:: MAI  Date initiated:: 02/16/17   Consult Status Consult Status: Complete Date: 02/16/17    Kathrin GreathouseMargaret Ann Deaun Rocha 02/16/2017, 12:19 PM

## 2017-02-16 NOTE — Progress Notes (Signed)
Postpartum day #2, VAVD  Subjective Pt without complaints.  Lochia normal.  Tolerating gen diet. No F/C/CP/SOB.  Ambulating and voiding without problems.  +BM  Temp:  [98 F (36.7 C)-98.6 F (37 C)] 98 F (36.7 C) (08/13 0537) Pulse Rate:  [66-69] 66 (08/13 0537) Resp:  [18-19] 19 (08/13 0537) BP: (123-131)/(81-87) 123/81 (08/13 0537)  Gen:  NAD, A&O x 3 Uterine fundus:  Firm, nontender Lochia normal Ext:  Minimal Edema, no calf tenderness bilaterally  CBC    Component Value Date/Time   WBC 15.9 (H) 02/14/2017 0548   RBC 3.46 (L) 02/14/2017 0548   HGB 10.1 (L) 02/14/2017 0548   HCT 29.8 (L) 02/14/2017 0548   PLT 132 (L) 02/14/2017 0548   MCV 86.1 02/14/2017 0548   MCH 29.2 02/14/2017 0548   MCHC 33.9 02/14/2017 0548   RDW 15.1 02/14/2017 0548     A/P: 21yo G1P1001 s/p VAVD, PPD #1 S/p SVD doing well. Routine postpartum care. Lactation support. Plan for discharge home today  Myna HidalgoOZAN, Neyda Durango, M 02/16/2017, 9:28 AM

## 2017-03-03 NOTE — Discharge Summary (Signed)
OB Discharge Summary     Patient Name: Sydney Adams DOB: 11-09-1995 MRN: [id number]  Date of admission: 02/13/2017 Delivering MD: Geryl Rankins   Date of discharge: 02/16/2017  Admitting diagnosis: 40WKS,WATER BROKE Intrauterine pregnancy: 104w5d     Secondary diagnosis:  Active Problems:   Normal intrauterine pregnancy in third trimester  Additional problems: none     Discharge diagnosis: Term Pregnancy Delivered                                                                                                Post partum procedures:none  Augmentation: Pitocin  Complications: None  Hospital course:  Onset of Labor With Vaginal Delivery     21 y.o. yo G1P1001 at [redacted]w[redacted]d was admitted in Latent Labor on 02/13/2017. Patient had an uncomplicated labor course as follows:  Membrane Rupture Time/Date: 11:22 AM ,02/13/2017   Intrapartum Procedures: Episiotomy: None [1]                                         Lacerations:  Labial [10]  Patient had a delivery of a Viable infant. 02/14/2017  Information for the patient's newborn:  Adams, Sydney Canny [[id number]]  Delivery Method: Vag-Spont    Pateint had an uncomplicated postpartum course.  She is ambulating, tolerating a regular diet, passing flatus, and urinating well. Patient is discharged home in stable condition on 02/16/17.   Physical exam  Vitals:   02/14/17 2322 02/15/17 0557 02/15/17 1910 02/16/17 0537  BP: 121/74 131/83 131/87 123/81  Pulse: 72 64 69 66  Resp: 16 16 18 19   Temp: 98.5 F (36.9 C) 97.9 F (36.6 C) 98.6 F (37 C) 98 F (36.7 C)  TempSrc: Oral Oral Oral Oral  SpO2:      Weight:      Height:       General: alert, cooperative and no distress Lochia: appropriate Uterine Fundus: firm Incision: N/A DVT Evaluation: No evidence of DVT seen on physical exam. Labs: Lab Results  Component Value Date   WBC 15.9 (H) 02/14/2017   HGB 10.1 (L) 02/14/2017   HCT 29.8 (L) 02/14/2017   MCV 86.1 02/14/2017   PLT  132 (L) 02/14/2017   CMP Latest Ref Rng & Units 02/13/2017  Glucose 65 - 99 mg/dL 75  BUN 6 - 20 mg/dL 7  Creatinine 3.82 - 5.05 mg/dL 3.97  Sodium 673 - 419 mmol/L 134(L)  Potassium 3.5 - 5.1 mmol/L 4.0  Chloride 101 - 111 mmol/L 105  CO2 22 - 32 mmol/L 19(L)  Calcium 8.9 - 10.3 mg/dL 9.4  Total Protein 6.5 - 8.1 g/dL 7.0  Total Bilirubin 0.3 - 1.2 mg/dL 0.6  Alkaline Phos 38 - 126 U/L 162(H)  AST 15 - 41 U/L 17  ALT 14 - 54 U/L 12(L)    Discharge instruction: per After Visit Summary and "Baby and Me Booklet".  After visit meds:  Allergies as of 02/16/2017      Reactions   Rocephin [ceftriaxone] Rash  Medication List    TAKE these medications   ferrous sulfate 325 (65 FE) MG tablet Take 1 tablet (325 mg total) by mouth daily with breakfast.   ibuprofen 600 MG tablet Commonly known as:  ADVIL,MOTRIN Take 1 tablet (600 mg total) by mouth every 6 (six) hours.   PRENATAL COMPLETE 14-0.4 MG Tabs Take 1 tablet by mouth daily.            Discharge Care Instructions        Start     Ordered   02/16/17 0000  ibuprofen (ADVIL,MOTRIN) 600 MG tablet  Every 6 hours     02/15/17 2241   02/15/17 0000  ferrous sulfate 325 (65 FE) MG tablet  Daily with breakfast     02/15/17 2241      Diet: routine diet  Activity: Advance as tolerated. Pelvic rest for 6 weeks.   Outpatient follow up:6 weeks Follow up Appt:No future appointments. Follow up Visit:No Follow-up on file.  Postpartum contraception: Progesterone only pills  Newborn Data: Live born female  Birth Weight: 6 lb 12.3 oz (3070 g) APGAR: 8, 9  Baby Feeding: Breast Disposition:home with mother   03/03/2017 Myna Hidalgo, M, DO

## 2017-03-12 ENCOUNTER — Ambulatory Visit (HOSPITAL_COMMUNITY)
Admission: EM | Admit: 2017-03-12 | Discharge: 2017-03-12 | Disposition: A | Discharge status: Home / Self Care | Attending: Internal Medicine | Admitting: Internal Medicine

## 2017-03-12 ENCOUNTER — Encounter (HOSPITAL_COMMUNITY): Payer: Self-pay | Admitting: Nurse Practitioner

## 2017-03-12 DIAGNOSIS — M779 Enthesopathy, unspecified: Principal | ICD-10-CM

## 2017-03-12 DIAGNOSIS — M778 Other enthesopathies, not elsewhere classified: Secondary | ICD-10-CM | POA: Diagnosis not present

## 2017-03-12 NOTE — Discharge Instructions (Signed)
Read instructions regarding treatment, ice, immobilization. Limit activity that makes the pain worse. He may need to follow-up with your doctor or hand surgeon if you are not getting better.

## 2017-03-12 NOTE — ED Triage Notes (Addendum)
Pt presents with c/o right wrist pain. The pain began about 1 month ago and has been persistent since onset.  She does not recall any injuries to the wrist.  She has tried applying ice to her wrist with no improvement

## 2017-03-12 NOTE — ED Provider Notes (Signed)
[facility]    CSN: [id number] Arrival date & time: 03/12/17  1653     History   Chief Complaint Chief Complaint  Patient presents with  . Wrist Pain    HPI Sydney Adams is a 21 y.o. female.   [age] year female complaining of pain along the dorsum of the proximal thumb and the radial aspect of the hand and wrist. This started about one month ago. She is unsure as to how this happened. She does not explain any repetitive movements although she is using the computer for all line schoolwork. Worse with thumb movement although she does have full range of motion. No trauma.      Past Medical History:  Diagnosis Date  . Chlamydia 07/2016    Patient Active Problem List   Diagnosis Date Noted  . Normal intrauterine pregnancy in third trimester 02/13/2017  . False labor after 37 weeks of gestation without delivery 01/24/2017    Past Surgical History:  Procedure Laterality Date  . NO PAST SURGERIES      OB History    Gravida Para Term Preterm AB Living   1 1 1     1    SAB TAB Ectopic Multiple Live Births         0 1       Home Medications    Prior to Admission medications   Medication Sig Start Date End Date Taking? Authorizing Provider  ferrous sulfate 325 (65 FE) MG tablet Take 1 tablet (325 mg total) by mouth daily with breakfast. 02/15/17   Myna Hidalgozan, Jennifer, DO  ibuprofen (ADVIL,MOTRIN) 600 MG tablet Take 1 tablet (600 mg total) by mouth every 6 (six) hours. 02/16/17   Myna Hidalgozan, Jennifer, DO  Prenatal Vit-Fe Fumarate-FA (PRENATAL COMPLETE) 14-0.4 MG TABS Take 1 tablet by mouth daily. 06/02/16   Trixie DredgeWest, Emily, PA-C    Family History History reviewed. No pertinent family history.  Social History Social History  Substance Use Topics  . Smoking status: Never Smoker  . Smokeless tobacco: Never Used  . Alcohol use No     Allergies   Rocephin [ceftriaxone]   Review of Systems Review of Systems  Constitutional: Negative.  Negative for activity change,  chills and fever.  HENT: Negative.   Respiratory: Negative.   Musculoskeletal:       As per HPI  Skin: Negative for color change, pallor and rash.  Neurological: Negative.   All other systems reviewed and are negative.    Physical Exam Triage Vital Signs ED Triage Vitals  Enc Vitals Group     BP 03/12/17 1715 (!) 132/91     Pulse Rate 03/12/17 1715 81     Resp 03/12/17 1715 16     Temp 03/12/17 1715 98 F (36.7 C)     Temp Source 03/12/17 1715 Oral     SpO2 03/12/17 1715 100 %     Weight --      Height --      Head Circumference --      Peak Flow --      Pain Score 03/12/17 1717 5     Pain Loc --      Pain Edu? --      Excl. in GC? --    No data found.   Updated Vital Signs BP (!) 132/91   Pulse 81   Temp 98 F (36.7 C) (Oral)   Resp 16   SpO2 100%   Visual Acuity Right Eye Distance:   Left  Eye Distance:   Bilateral Distance:    Right Eye Near:   Left Eye Near:    Bilateral Near:     Physical Exam  Constitutional: She is oriented to person, place, and time. She appears well-developed and well-nourished. No distress.  HENT:  Head: Normocephalic and atraumatic.  Eyes: EOM are normal.  Neck: Neck supple.  Musculoskeletal:  Tenderness along the radial aspect of the wrist particularly along the extensor pollicis longus. Positive Finkelstein sign. Demonstrates full range of motion of the wrist and thumb. No hand or other digit pain or tenderness. Distal neurovascular motor sensory grossly intact.  Lymphadenopathy:    She has no cervical adenopathy.  Neurological: She is alert and oriented to person, place, and time. No cranial nerve deficit.  Skin: Skin is warm and dry.  Psychiatric: She has a normal mood and affect.  Nursing note and vitals reviewed.    UC Treatments / Results  Labs (all labs ordered are listed, but only abnormal results are displayed) Labs Reviewed - No data to display  EKG  EKG Interpretation None       Radiology No  results found.  Procedures Procedures (including critical care time)  Medications Ordered in UC Medications - No data to display   Initial Impression / Assessment and Plan / UC Course  I have reviewed the triage vital signs and the nursing notes.  Pertinent labs & imaging results that were available during my care of the patient were reviewed by me and considered in my medical decision making (see chart for details).     Read instructions regarding treatment, ice, immobilization. Limit activity that makes the pain worse. He may need to follow-up with your doctor or hand surgeon if you are not getting better.   Final Clinical Impressions(s) / UC Diagnoses   Final diagnoses:  Tendinitis of thumb    New Prescriptions New Prescriptions   No medications on file     Controlled Substance Prescriptions [place] Controlled Substance Registry consulted? Not Applicable   Hayden Rasmussen, NP 03/12/17 1820

## 2017-03-30 ENCOUNTER — Ambulatory Visit (HOSPITAL_COMMUNITY): Admission: EM | Admit: 2017-03-30 | Discharge: 2017-03-30 | Disposition: A | Discharge status: Home / Self Care

## 2017-03-30 ENCOUNTER — Encounter (HOSPITAL_COMMUNITY): Payer: Self-pay | Admitting: Emergency Medicine

## 2017-03-30 DIAGNOSIS — M940 Chondrocostal junction syndrome [Tietze]: Secondary | ICD-10-CM | POA: Diagnosis not present

## 2017-03-30 DIAGNOSIS — R0789 Other chest pain: Secondary | ICD-10-CM

## 2017-03-30 NOTE — ED Triage Notes (Signed)
PT reports central chest pain that started yesterday. It's sometimes worse with a deep breath. No associated symptoms.

## 2017-03-30 NOTE — ED Provider Notes (Signed)
[facility]    CSN: [id number] Arrival date & time: 03/30/17  1819     History   Chief Complaint Chief Complaint  Patient presents with  . Chest Pain    HPI Ramonda Galyon is a 21 y.o. female.   21 year old female complaining of mid upper chest pain for 24 hours. It is worse when she coughs. No known injury. She states when she does feel the pain she feels short of breath. Currently she is sitting on a chair when her baby in the unit with a bottle. Her posture is completely relaxed, respirations are even and nonlabored she showing no signs of illness or distress.      Past Medical History:  Diagnosis Date  . Chlamydia 07/2016    Patient Active Problem List   Diagnosis Date Noted  . Normal intrauterine pregnancy in third trimester 02/13/2017  . False labor after 37 weeks of gestation without delivery 01/24/2017    Past Surgical History:  Procedure Laterality Date  . NO PAST SURGERIES      OB History    Gravida Para Term Preterm AB Living   SAB TAB Ectopic Multiple Live Births         0 1       Home Medications    Prior to Admission medications   Medication Sig Start Date End Date Taking? Authorizing Provider  Prenatal Vit-Fe Fumarate-FA (PRENATAL COMPLETE) 14-0.4 MG TABS Take 1 tablet by mouth daily. 06/02/16  Yes West, Emily, PA-C  ferrous sulfate 325 (65 FE) MG tablet Take 1 tablet (325 mg total) by mouth daily with breakfast. 02/15/17   Myna Hidalgo, DO  ibuprofen (ADVIL,MOTRIN) 600 MG tablet Take 1 tablet (600 mg total) by mouth every 6 (six) hours. 02/16/17   Myna Hidalgo, DO    Family History No family history on file.  Social History Social History  Substance Use Topics  . Smoking status: Never Smoker  . Smokeless tobacco: Never Used  . Alcohol use No     Allergies   Rocephin [ceftriaxone]   Review of Systems Review of Systems  Constitutional: Negative.   HENT: Negative.   Respiratory: Negative for chest  tightness and wheezing.   Cardiovascular: Positive for chest pain.  Gastrointestinal: Negative.   Neurological: Negative.   All other systems reviewed and are negative.    Physical Exam Triage Vital Signs ED Triage Vitals  Enc Vitals Group     BP 03/30/17 1939 130/87     Pulse Rate 03/30/17 1939 62     Resp 03/30/17 1939 16     Temp 03/30/17 1939 98.4 F (36.9 C)     Temp Source 03/30/17 1939 Oral     SpO2 03/30/17 1939 100 %     Weight 03/30/17 1940 135 lb (61.2 kg)     Height 03/30/17 1940  (1.626 m)     Head Circumference --      Peak Flow --      Pain Score 03/30/17 1941 5     Pain Loc --      Pain Edu? --      Excl. in GC? --    No data found.   Updated Vital Signs BP 130/87   Pulse 62   Temp 98.4 F (36.9 C) (Oral)   Resp 16   Ht  (1.626 m)   Wt 135 lb (61.2 kg)   LMP 03/27/2017   SpO2  100%   BMI 23.17 kg/m   Visual Acuity Right Eye Distance:   Left Eye Distance:   Bilateral Distance:    Right Eye Near:   Left Eye Near:    Bilateral Near:     Physical Exam  Constitutional: She is oriented to person, place, and time. She appears well-developed and well-nourished. No distress.  Eyes: EOM are normal.  Neck: Normal range of motion. Neck supple.  Cardiovascular: Normal rate, regular rhythm, normal heart sounds and intact distal pulses.   Pulmonary/Chest: Effort normal and breath sounds normal. She has no wheezes. She has no rales. She exhibits tenderness.  Positive, reproducible chest wall tenderness with palpation of the upper parasternal borders and manubrium.  Musculoskeletal: She exhibits no edema.  Neurological: She is alert and oriented to person, place, and time. She exhibits normal muscle tone.  Skin: Skin is warm and dry.  Psychiatric: She has a normal mood and affect.  Nursing note and vitals reviewed.    UC Treatments / Results  Labs (all labs ordered are listed, but only abnormal results are displayed) Labs Reviewed - No  data to display  EKG  EKG Interpretation None       Radiology No results found.  Procedures Procedures (including critical care time)  Medications Ordered in UC Medications - No data to display   Initial Impression / Assessment and Plan / UC Course  I have reviewed the triage vital signs and the nursing notes.  Pertinent labs & imaging results that were available during my care of the patient were reviewed by me and considered in my medical decision making (see chart for details).    Apply ice to the area of pain to the upper chest off and on. He take ibuprofen or Aleve to help with inflammation and pain.    Final Clinical Impressions(s) / UC Diagnoses   Final diagnoses:  Chest wall pain  Costochondritis    New Prescriptions New Prescriptions   No medications on file     Controlled Substance Prescriptions [place] Controlled Substance Registry consulted? Not Applicable   Hayden Rasmussen, NP 03/30/17 2054

## 2017-03-30 NOTE — Discharge Instructions (Signed)
Apply ice to the area of pain to the upper chest off and on. He take ibuprofen or Aleve to help with inflammation and pain.

## 2018-04-06 ENCOUNTER — Other Ambulatory Visit: Payer: Self-pay

## 2018-04-06 ENCOUNTER — Ambulatory Visit (HOSPITAL_COMMUNITY)
Admission: EM | Admit: 2018-04-06 | Discharge: 2018-04-06 | Disposition: A | Discharge status: Home / Self Care | Attending: Family Medicine | Admitting: Family Medicine

## 2018-04-06 ENCOUNTER — Encounter (HOSPITAL_COMMUNITY): Payer: Self-pay | Admitting: Emergency Medicine

## 2018-04-06 DIAGNOSIS — K1379 Other lesions of oral mucosa: Secondary | ICD-10-CM | POA: Diagnosis not present

## 2018-04-06 DIAGNOSIS — K130 Diseases of lips: Secondary | ICD-10-CM

## 2018-04-06 NOTE — Discharge Instructions (Addendum)
I am referring you to a surgeon for removal

## 2018-04-06 NOTE — ED Provider Notes (Signed)
[facility]    CSN: [id number] Arrival date & time: 04/06/18  1804     History   Chief Complaint Chief Complaint  Patient presents with  . Mouth Lesions    HPI Sydney Adams is a 22 y.o. female.   HPI  Lump inside lower lip Painless Does not recall trauma  Past Medical History:  Diagnosis Date  . Chlamydia 07/2016    Patient Active Problem List   Diagnosis Date Noted  . Normal intrauterine pregnancy in third trimester 02/13/2017  . False labor after 37 weeks of gestation without delivery 01/24/2017    Past Surgical History:  Procedure Laterality Date  . NO PAST SURGERIES      OB History    Gravida  1   Para  1   Term  1   Preterm      AB      Living  1     SAB      TAB      Ectopic      Multiple  0   Live Births  1            Home Medications    Prior to Admission medications   Medication Sig Start Date End Date Taking? Authorizing Provider  ibuprofen (ADVIL,MOTRIN) 600 MG tablet Take 1 tablet (600 mg total) by mouth every 6 (six) hours. 02/16/17  Yes Myna Hidalgo, DO  ferrous sulfate 325 (65 FE) MG tablet Take 1 tablet (325 mg total) by mouth daily with breakfast. 02/15/17   Myna Hidalgo, DO  Prenatal Vit-Fe Fumarate-FA (PRENATAL COMPLETE) 14-0.4 MG TABS Take 1 tablet by mouth daily. 06/02/16   Trixie Dredge, PA-C    Family History History reviewed. No pertinent family history.  Social History Social History   Tobacco Use  . Smoking status: Never Smoker  . Smokeless tobacco: Never Used  Substance Use Topics  . Alcohol use: No  . Drug use: No     Allergies   Rocephin [ceftriaxone]   Review of Systems Review of Systems  Constitutional: Negative for chills and fever.  HENT: Negative for ear pain and sore throat.   Eyes: Negative for pain and visual disturbance.  Respiratory: Negative for cough and shortness of breath.   Cardiovascular: Negative for chest pain and palpitations.  Gastrointestinal: Negative  for abdominal pain and vomiting.  Genitourinary: Negative for dysuria and hematuria.  Musculoskeletal: Negative for arthralgias and back pain.  Skin: Negative for color change and rash.  Neurological: Negative for seizures and syncope.  All other systems reviewed and are negative.    Physical Exam Triage Vital Signs ED Triage Vitals [04/06/18 1850]  Enc Vitals Group     BP (!) 116/47     Pulse Rate 69     Resp      Temp 98.1 F (36.7 C)     Temp Source Oral     SpO2 100 %     Weight      Height      Head Circumference      Peak Flow      Pain Score 0     Pain Loc      Pain Edu?      Excl. in GC?    No data found.  Updated Vital Signs BP (!) 116/47 (BP Location: Left Arm)   Pulse 69   Temp 98.1 F (36.7 C) (Oral)   LMP  (LMP Unknown)   SpO2 100%   Visual Acuity Right  Eye Distance:   Left Eye Distance:   Bilateral Distance:    Right Eye Near:   Left Eye Near:    Bilateral Near:     Physical Exam  Constitutional: She appears well-developed and well-nourished. No distress.  HENT:  Head: Normocephalic and atraumatic.  Right Ear: External ear normal.  Left Ear: External ear normal.  Mouth/Throat: Oropharynx is clear and moist.    Eyes: Pupils are equal, round, and reactive to light. Conjunctivae are normal.  Neck: Normal range of motion.  Cardiovascular: Normal rate.  Pulmonary/Chest: Effort normal. No respiratory distress.  Abdominal: Soft. She exhibits no distension.  Musculoskeletal: Normal range of motion. She exhibits no edema.  Neurological: She is alert.  Skin: Skin is warm and dry.     UC Treatments / Results  Labs (all labs ordered are listed, but only abnormal results are displayed) Labs Reviewed - No data to display  EKG None  Radiology No results found.  Procedures Procedures (including critical care time)  Medications Ordered in UC Medications - No data to display  Initial Impression / Assessment and Plan / UC Course  I have  reviewed the triage vital signs and the nursing notes.  Pertinent labs & imaging results that were available during my care of the patient were reviewed by me and considered in my medical decision making (see chart for details).     Final Clinical Impressions(s) / UC Diagnoses   Final diagnoses:  Mucocele of lower lip     Discharge Instructions     I am referring you to a surgeon for removal   ED Prescriptions    None     Controlled Substance Prescriptions [place] Controlled Substance Registry consulted? Not Applicable   Eustace Moore, MD 04/06/18 2124

## 2018-04-06 NOTE — ED Triage Notes (Signed)
Pt has a large bump on the inside of her left lower lip.  She states it is not painful and it goes away at night when she is asleep, but then gradually grows back every day.  She states she has had it for about 3 weeks.  She denies any injury to the lip.

## 2018-04-12 IMAGING — US US OB COMP LESS 14 WK
1 series · 13 of 28 positions shown · non-contrast
Comparison: None.

CLINICAL DATA: Abdominal pain and cramping, dizziness for a short
time today. Quantitative beta HCG today is 452.

LMP was05/05/2016.
Gestational age by LMP is4 weeks 0 days.
EDC by LMP is02/09/2017.
EXAM:
OBSTETRIC <14 WK US AND TRANSVAGINAL OB US
TECHNIQUE: Both transabdominal and transvaginal ultrasound examinations were
performed for complete evaluation of the gestation as well as the
maternal uterus, adnexal regions, and pelvic cul-de-sac.
Transvaginal technique was performed to assess early pregnancy.

[Series 1: us ob comp less 14 wk · 0.19mm/px · 13 of 60 slices shown]
[im 3/60]
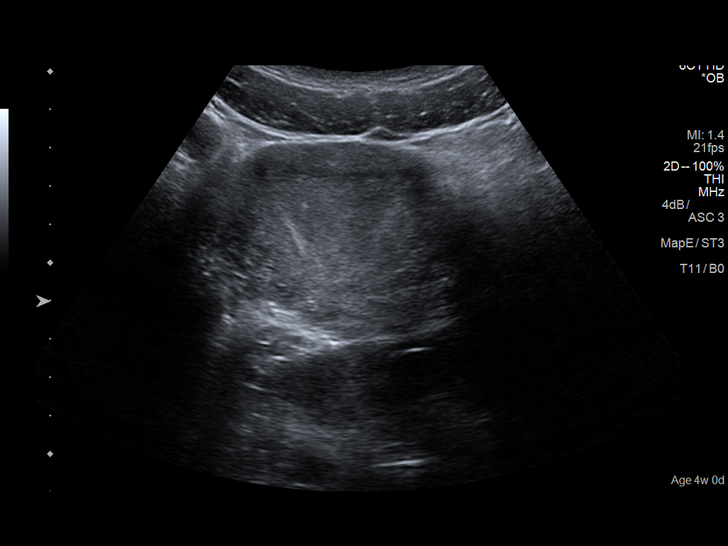
[im 7/60]
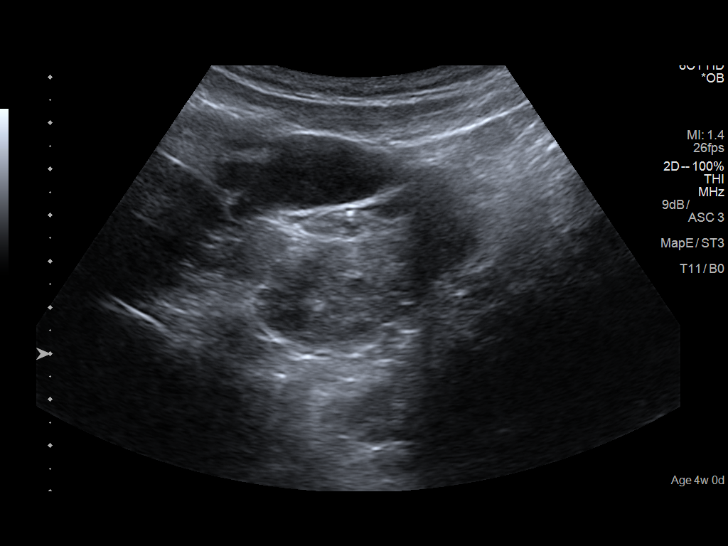
[im 11/60]
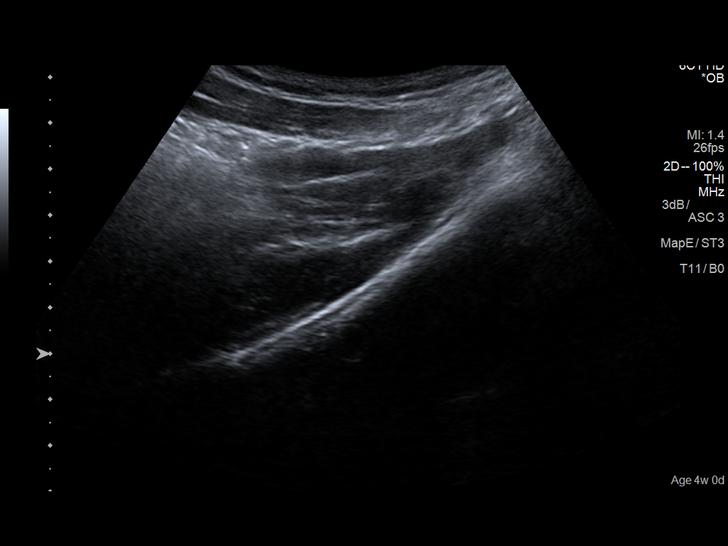
[im 16/60]
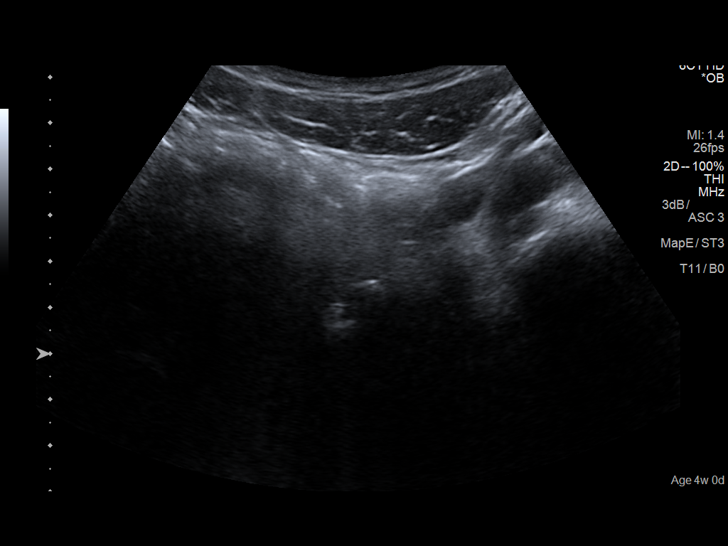
[im 20/60]
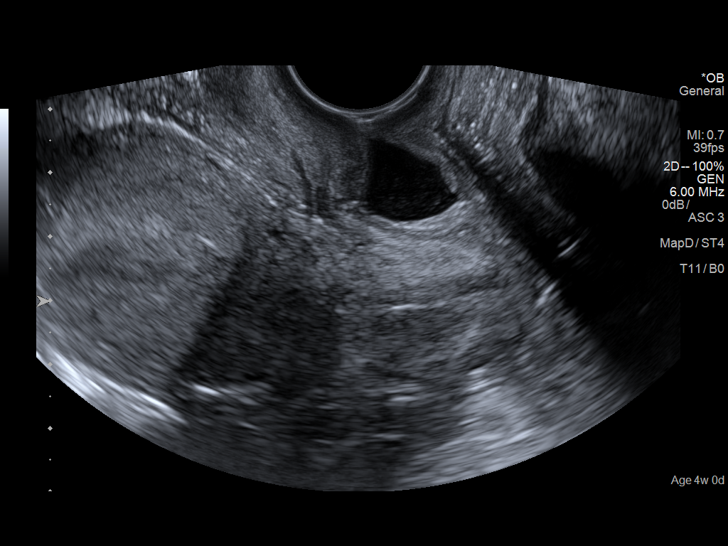
[im 25/60]
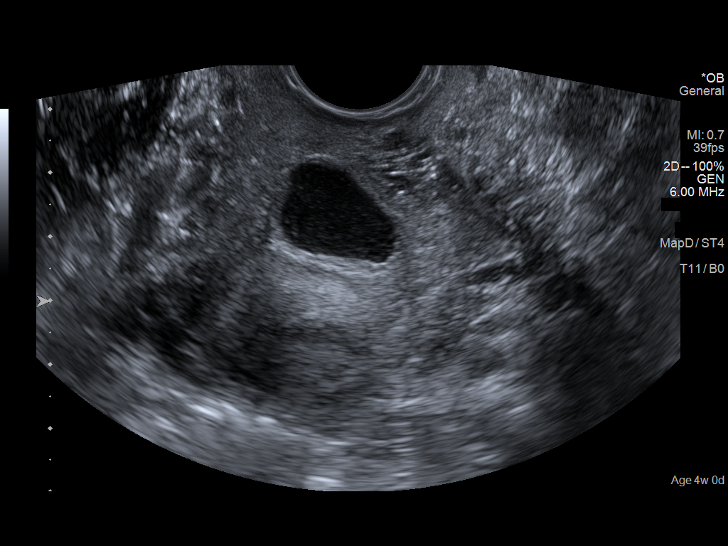
[im 31/60]
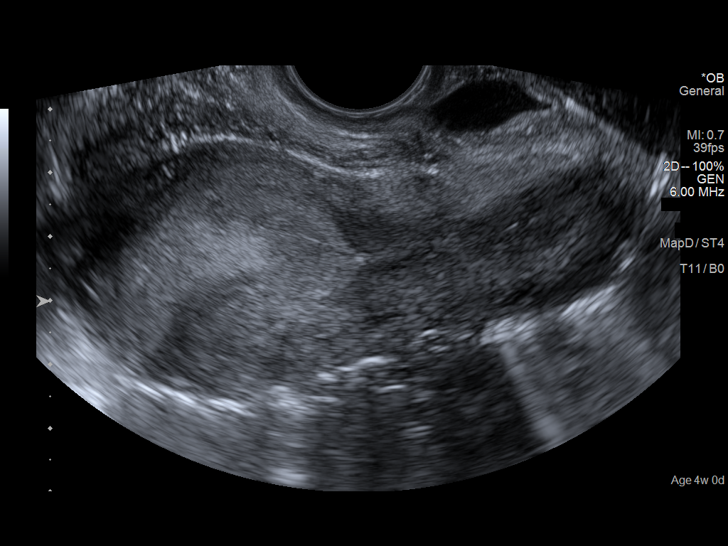
[im 35/60]
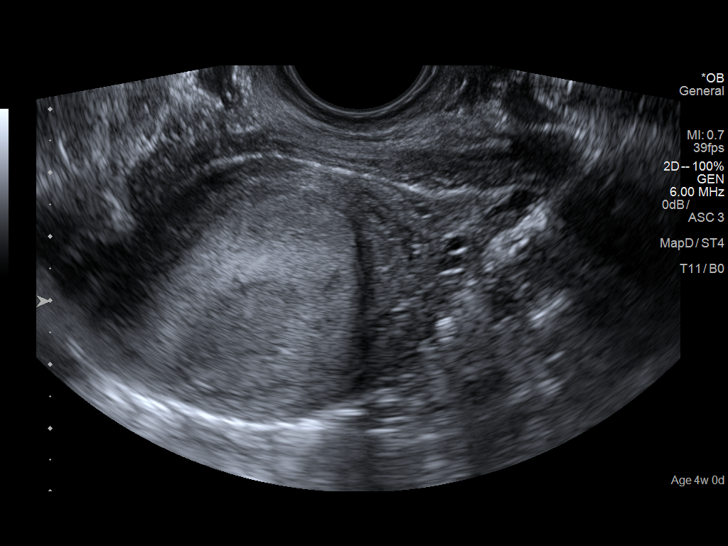
[im 40/60]
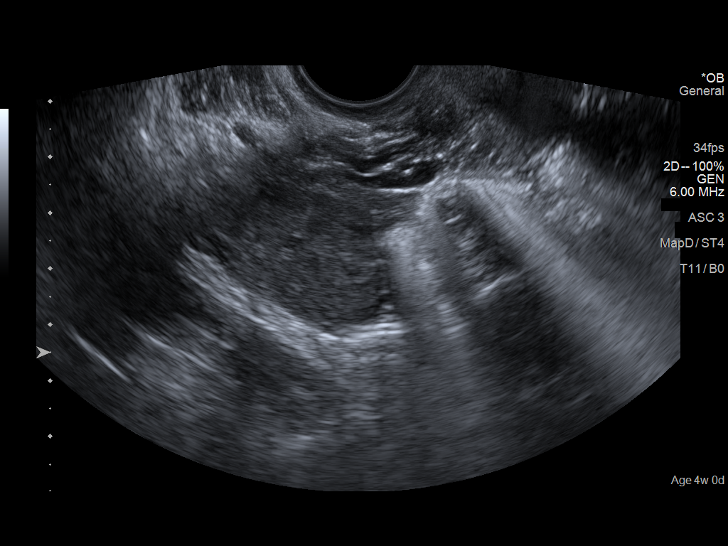
[im 44/60]
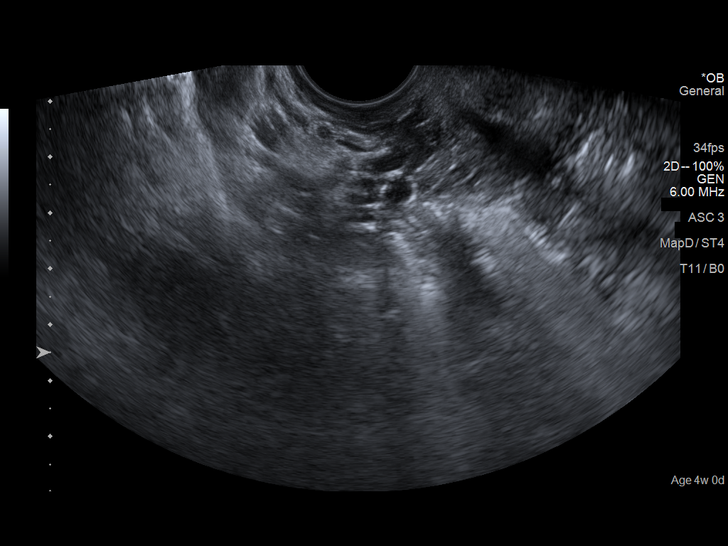
[im 49/60]
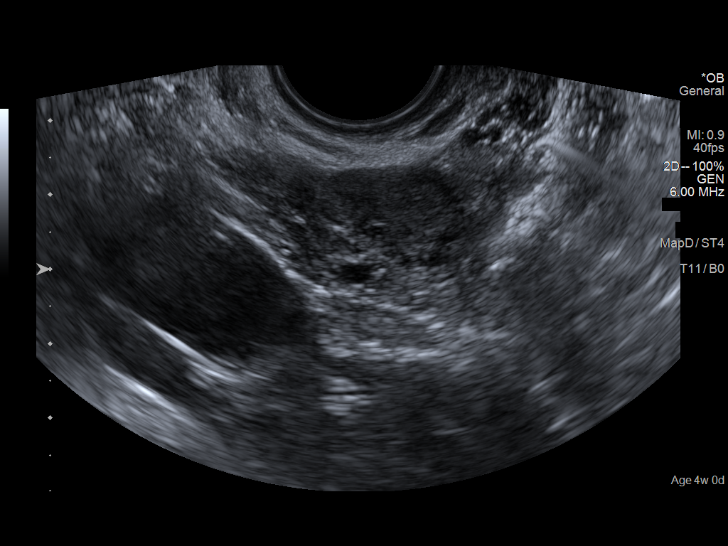
[im 53/60]
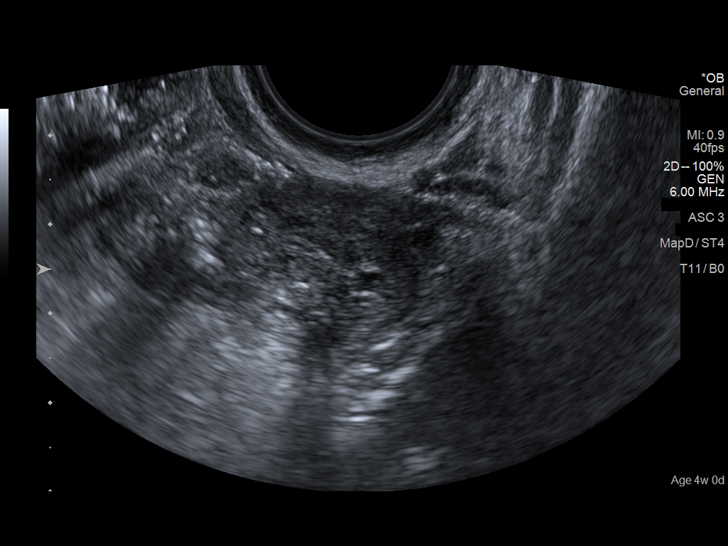
[im 57/60]
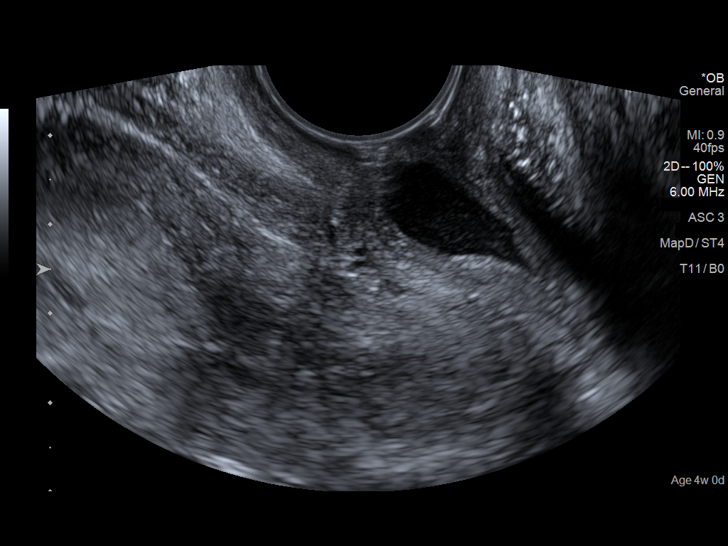

[13 of 28 positions shown; findings below may reference images not displayed]

FINDINGS: Intrauterine gestational sac: None

Yolk sac:  None

Embryo:  None

Cardiac Activity: None

Subchorionic hemorrhage:  None visualized.

Maternal uterus/adnexae: The ovaries have a normal appearance. A
collection of fluid is identified within the vaginal canal measuring
1.6 x 1.5 x 2.0 cm. It is not clear whether this represents a cystic
structure or merely a collection of fluid in the canal. No
intraperitoneal fluid is identified.
IMPRESSION: 1. Pregnancy of unknown location. Considerations include completed
spontaneous abortion, early intrauterine pregnancy or early ectopic
pregnancy. Serial quantitative beta HCG values and follow-up
ultrasound are recommended as appropriate to document progression of
and location of pregnancy. Ectopic pregnancy has not been excluded.
2. Collection of fluid or possible cystic structure within the
vaginal canal. Followup for this finding can be performed at the
time of the next exam.

## 2018-05-02 ENCOUNTER — Emergency Department (HOSPITAL_COMMUNITY)
Admission: EM | Admit: 2018-05-02 | Discharge: 2018-05-02 | Disposition: A | Discharge status: Home / Self Care | Attending: Emergency Medicine | Admitting: Emergency Medicine

## 2018-05-02 ENCOUNTER — Encounter (HOSPITAL_COMMUNITY): Payer: Self-pay | Admitting: Emergency Medicine

## 2018-05-02 DIAGNOSIS — Z79899 Other long term (current) drug therapy: Secondary | ICD-10-CM | POA: Diagnosis not present

## 2018-05-02 DIAGNOSIS — N390 Urinary tract infection, site not specified: Secondary | ICD-10-CM | POA: Diagnosis not present

## 2018-05-02 DIAGNOSIS — R102 Pelvic and perineal pain: Secondary | ICD-10-CM | POA: Diagnosis present

## 2018-05-02 DIAGNOSIS — B373 Candidiasis of vulva and vagina: Secondary | ICD-10-CM

## 2018-05-02 DIAGNOSIS — B3731 Acute candidiasis of vulva and vagina: Secondary | ICD-10-CM

## 2018-05-02 LAB — URINALYSIS, ROUTINE W REFLEX MICROSCOPIC
Bilirubin Urine: NEGATIVE
Glucose, UA: NEGATIVE mg/dL
Ketones, ur: NEGATIVE mg/dL
Nitrite: NEGATIVE
PROTEIN: 30 mg/dL — AB
Specific Gravity, Urine: 1.029 (ref 1.005–1.030)
pH: 6 (ref 5.0–8.0)

## 2018-05-02 LAB — POC URINE PREG, ED: PREG TEST UR: NEGATIVE

## 2018-05-02 LAB — WET PREP, GENITAL
CLUE CELLS WET PREP: NONE SEEN
Sperm: NONE SEEN
TRICH WET PREP: NONE SEEN

## 2018-05-02 MED ORDER — AMOXICILLIN 500 MG PO CAPS: 500.0000 mg | ORAL_CAPSULE | Freq: Three times a day (TID) | ORAL | 0 refills | Status: DC

## 2018-05-02 MED ORDER — FLUCONAZOLE 150 MG PO TABS: ORAL_TABLET | ORAL | 1 refills | Status: DC

## 2018-05-02 NOTE — Discharge Instructions (Signed)
You have cultures pending.  Take medications as directed.  Return if any problems.

## 2018-05-02 NOTE — ED Notes (Signed)
Patient able to ambulate independently  

## 2018-05-02 NOTE — ED Triage Notes (Signed)
Pt to ER for vaginal pain x2 days, reports she believes she has a swollen bartholin gland. Reports green discharge. Denies chills and fever.

## 2018-05-02 NOTE — ED Notes (Signed)
Pelvic cart set up outside of room. 

## 2018-05-02 NOTE — ED Provider Notes (Signed)
[facility] EMERGENCY DEPARTMENT Provider Note   CSN: [id number] Arrival date & time: 05/02/18  1304     History   Chief Complaint Chief Complaint  Patient presents with  . Vaginal Pain    HPI Sydney Adams is a 22 y.o. female.  The history is provided by the patient. No language interpreter was used.  Vaginal Pain  This is a new problem. The current episode started 2 days ago. The problem occurs constantly. The problem has not changed since onset.Nothing aggravates the symptoms. Nothing relieves the symptoms. She has tried nothing for the symptoms. The treatment provided no relief.    Past Medical History:  Diagnosis Date  . Chlamydia 07/2016    Patient Active Problem List   Diagnosis Date Noted  . Normal intrauterine pregnancy in third trimester 02/13/2017  . False labor after 37 weeks of gestation without delivery 01/24/2017    Past Surgical History:  Procedure Laterality Date  . NO PAST SURGERIES       OB History    Gravida  1   Para  1   Term  1   Preterm      AB      Living  1     SAB      TAB      Ectopic      Multiple  0   Live Births  1            Home Medications    Prior to Admission medications   Medication Sig Start Date End Date Taking? Authorizing Provider  etonogestrel (NEXPLANON) 68 MG IMPL implant 1 each by Subdermal route once. 03/2017 left arm   Yes [provider]  ferrous sulfate 325 (65 FE) MG tablet Take 1 tablet (325 mg total) by mouth daily with breakfast. Patient not taking: Reported on 05/02/2018 02/15/17   Myna Hidalgo, DO  ibuprofen (ADVIL,MOTRIN) 600 MG tablet Take 1 tablet (600 mg total) by mouth every 6 (six) hours. Patient not taking: Reported on 05/02/2018 02/16/17   Myna Hidalgo, DO  Prenatal Vit-Fe Fumarate-FA (PRENATAL COMPLETE) 14-0.4 MG TABS Take 1 tablet by mouth daily. Patient not taking: Reported on 05/02/2018 06/02/16   Trixie Dredge, PA-C    Family  History History reviewed. No pertinent family history.  Social History Social History   Tobacco Use  . Smoking status: Never Smoker  . Smokeless tobacco: Never Used  Substance Use Topics  . Alcohol use: No  . Drug use: No     Allergies   Rocephin [ceftriaxone]   Review of Systems Review of Systems  Genitourinary: Positive for vaginal pain.  All other systems reviewed and are negative.    Physical Exam Updated Vital Signs BP 121/77 (BP Location: Right Arm)   Pulse 83   Temp 98.7 F (37.1 C) (Oral)   Resp 15   LMP  (LMP Unknown)   SpO2 99%   Physical Exam  Constitutional: She appears well-developed and well-nourished.  HENT:  Head: Normocephalic and atraumatic.  Eyes: Pupils are equal, round, and reactive to light. Conjunctivae are normal.  Neck: Normal range of motion. Neck supple.  Cardiovascular: Normal rate.  Abdominal: Soft. There is no tenderness.  Genitourinary: Vaginal discharge found.  Genitourinary Comments: Vaginal discharge,  Thick greenish,  Clumpy looks like yeast  Musculoskeletal: Normal range of motion.  Skin: Skin is warm.     ED Treatments / Results  Labs (all labs ordered are listed, but only abnormal results are displayed)  Labs Reviewed  WET PREP, GENITAL - Abnormal; Notable for the following components:      Result Value   Yeast Wet Prep HPF POC PRESENT (*)    WBC, Wet Prep HPF POC MODERATE (*)    All other components within normal limits  URINALYSIS, ROUTINE W REFLEX MICROSCOPIC - Abnormal; Notable for the following components:   APPearance HAZY (*)    Hgb urine dipstick SMALL (*)    Protein, ur 30 (*)    Leukocytes, UA LARGE (*)    Bacteria, UA RARE (*)    All other components within normal limits  POC URINE PREG, ED  GC/CHLAMYDIA PROBE AMP ([place]) NOT AT [facility]    EKG None  Radiology No results found.  Procedures Procedures (including critical care time)  Medications Ordered in ED Medications - No data to  display   Initial Impression / Assessment and Plan / ED Course  I have reviewed the triage vital signs and the nursing notes.  Pertinent labs & imaging results that were available during my care of the patient were reviewed by me and considered in my medical decision making (see chart for details).     MDM  Pt has a uti and a yeast vaginitis.  Pt given rx for diflucan and keflex,  GC and Ct pending.  Pt does not think she has any std risk.    Final Clinical Impressions(s) / ED Diagnoses   Final diagnoses:  Yeast vaginitis  Urinary tract infection without hematuria, site unspecified    ED Discharge Orders         Ordered    fluconazole (DIFLUCAN) 150 MG tablet     05/02/18 1625    amoxicillin (AMOXIL) 500 MG capsule  3 times daily     05/02/18 1625        An After Visit Summary was printed and given to the patient.    Elson Areas, PA-C 05/02/18 1627    Charlynne Pander, MD 05/04/18 [phone]

## 2018-05-04 LAB — GC/CHLAMYDIA PROBE AMP (~~LOC~~) NOT AT ARMC
CHLAMYDIA, DNA PROBE: NEGATIVE
NEISSERIA GONORRHEA: NEGATIVE

## 2018-10-01 ENCOUNTER — Ambulatory Visit (HOSPITAL_COMMUNITY): Admission: EM | Admit: 2018-10-01 | Discharge: 2018-10-01 | Discharge status: Against Medical Advice

## 2018-10-01 NOTE — ED Notes (Signed)
Pt lwbs 

## 2020-12-24 ENCOUNTER — Other Ambulatory Visit: Payer: Self-pay

## 2020-12-24 ENCOUNTER — Ambulatory Visit (HOSPITAL_COMMUNITY)
Admission: EM | Admit: 2020-12-24 | Discharge: 2020-12-24 | Disposition: A | Discharge status: Home / Self Care | Attending: Internal Medicine | Admitting: Internal Medicine

## 2020-12-24 ENCOUNTER — Encounter (HOSPITAL_COMMUNITY): Payer: Self-pay

## 2020-12-24 DIAGNOSIS — R109 Unspecified abdominal pain: Secondary | ICD-10-CM

## 2020-12-24 MED ORDER — IBUPROFEN 600 MG PO TABS: 600.0000 mg | ORAL_TABLET | Freq: Four times a day (QID) | ORAL | 0 refills | Status: DC | PRN

## 2020-12-24 MED ORDER — KETOROLAC TROMETHAMINE 30 MG/ML IJ SOLN
INTRAMUSCULAR | Status: AC
  Filled 2020-12-24: qty 1

## 2020-12-24 MED ORDER — KETOROLAC TROMETHAMINE 30 MG/ML IJ SOLN
30.0000 mg | Freq: Once | INTRAMUSCULAR | Status: AC
  Administered 2020-12-24: 30 mg via INTRAMUSCULAR

## 2020-12-24 NOTE — ED Triage Notes (Signed)
Pt abdominal pain all over x2 days. States started her menstrual 4 days ago. Pt c/o n/vx x2 yesterday. States hasn't ate anything today. States had a watery stool yesterday.

## 2020-12-24 NOTE — Discharge Instructions (Addendum)
Heating pad use on a 15 minutes on-15 minutes off cycle 4 times twice daily Take medications as prescribed Return to urgent care if symptoms worsen

## 2020-12-25 NOTE — ED Provider Notes (Signed)
[facility]    CSN: [id number] Arrival date & time: 12/24/20  1824      History   Chief Complaint Chief Complaint  Patient presents with   Abdominal Pain    HPI Sydney Adams is a 25 y.o. femalecomes to the urgent care with 2 day history of generalized abdominal pain. Pain is dull and throbbing and currently 10 out of 10.No trauma to the abdomen.Patient works with [employer] and carries heavy items repeatedly.No known relieving factors.Patient had some nausea and non-bloody non-bilious vomiting. She denies association with food. Her menses started 4 days ago. No diarrhea.   HPI  Past Medical History:  Diagnosis Date   Chlamydia 07/2016    Patient Active Problem List   Diagnosis Date Noted   Normal intrauterine pregnancy in third trimester 02/13/2017   False labor after 37 weeks of gestation without delivery 01/24/2017    Past Surgical History:  Procedure Laterality Date   NO PAST SURGERIES      OB History     Gravida  1   Para  1   Term  1   Preterm      AB      Living  1      SAB      IAB      Ectopic      Multiple  0   Live Births  1            Home Medications    Prior to Admission medications   Medication Sig Start Date End Date Taking? Authorizing Provider  ibuprofen (ADVIL) 600 MG tablet Take 1 tablet (600 mg total) by mouth every 6 (six) hours as needed. 12/24/20  Yes Brenda Samano, Britta Mccreedy, MD    Family History History reviewed. No pertinent family history.  Social History Social History   Tobacco Use   Smoking status: Never   Smokeless tobacco: Never  Substance Use Topics   Alcohol use: No   Drug use: No     Allergies   Rocephin [ceftriaxone]   Review of Systems Review of Systems  Constitutional: Negative.   Cardiovascular: Negative.   Gastrointestinal:  Positive for abdominal pain, nausea and vomiting.  Musculoskeletal: Negative.   Skin: Negative.     Physical Exam Triage Vital Signs ED Triage Vitals   Enc Vitals Group     BP 12/24/20 1912 111/70     Pulse Rate 12/24/20 1912 86     Resp --      Temp 12/24/20 1912 99.3 F (37.4 C)     Temp Source 12/24/20 1912 Oral     SpO2 12/24/20 1912 100 %     Weight --      Height --      Head Circumference --      Peak Flow --      Pain Score 12/24/20 1913 10     Pain Loc --      Pain Edu? --      Excl. in GC? --    No data found.  Updated Vital Signs BP 111/70 (BP Location: Right Arm)   Pulse 86   Temp 99.3 F (37.4 C) (Oral)   LMP 12/20/2020   SpO2 100%   Visual Acuity Right Eye Distance:   Left Eye Distance:   Bilateral Distance:    Right Eye Near:   Left Eye Near:    Bilateral Near:     Physical Exam Vitals and nursing note reviewed.  Constitutional:  General: She is not in acute distress.    Appearance: She is not ill-appearing.  Abdominal:     General: Bowel sounds are normal.     Palpations: Abdomen is soft. There is no hepatomegaly or splenomegaly.     Tenderness: There is generalized abdominal tenderness. There is no guarding or rebound. Negative signs include Murphy's sign, McBurney's sign and psoas sign.  Neurological:     Mental Status: She is alert.     UC Treatments / Results  Labs (all labs ordered are listed, but only abnormal results are displayed) Labs Reviewed - No data to display  EKG   Radiology No results found.  Procedures Procedures (including critical care time)  Medications Ordered in UC Medications  ketorolac (TORADOL) 30 MG/ML injection 30 mg (30 mg Intramuscular Given 12/24/20 1944)    Initial Impression / Assessment and Plan / UC Course  I have reviewed the triage vital signs and the nursing notes.  Pertinent labs & imaging results that were available during my care of the patient were reviewed by me and considered in my medical decision making (see chart for details).     1.  Abdominal wall pain, musculoskeletal: Toradol 30 mg IM x1 dose Ibuprofen 600 mg every 6  hours as needed for pain Heating pad use will be helpful Return to urgent care for worsening abdominal pain. Final Clinical Impressions(s) / UC Diagnoses   Final diagnoses:  Abdominal wall pain     Discharge Instructions      Heating pad use on a 15 minutes on-15 minutes off cycle 4 times twice daily Take medications as prescribed Return to urgent care if symptoms worsen   ED Prescriptions     Medication Sig Dispense Auth. Provider   ibuprofen (ADVIL) 600 MG tablet Take 1 tablet (600 mg total) by mouth every 6 (six) hours as needed. 30 tablet Shaquitta Burbridge, Britta Mccreedy, MD      PDMP not reviewed this encounter.   Merrilee Jansky, MD 12/25/20 [phone]

## 2024-03-18 ENCOUNTER — Other Ambulatory Visit (HOSPITAL_COMMUNITY)
Admission: RE | Admit: 2024-03-18 | Discharge: 2024-03-18 | Disposition: A | Discharge status: Home / Self Care | Source: Ambulatory Visit | Attending: Certified Nurse Midwife | Admitting: Certified Nurse Midwife

## 2024-03-18 ENCOUNTER — Ambulatory Visit (INDEPENDENT_AMBULATORY_CARE_PROVIDER_SITE_OTHER): Admitting: Certified Nurse Midwife

## 2024-03-18 ENCOUNTER — Encounter (HOSPITAL_BASED_OUTPATIENT_CLINIC_OR_DEPARTMENT_OTHER): Payer: Self-pay | Admitting: Certified Nurse Midwife

## 2024-03-18 VITALS — BP 108/69 | HR 54 | Ht 63.0 in | Wt 123.2 lb

## 2024-03-18 DIAGNOSIS — Z113 Encounter for screening for infections with a predominantly sexual mode of transmission: Secondary | ICD-10-CM | POA: Insufficient documentation

## 2024-03-18 DIAGNOSIS — Z01419 Encounter for gynecological examination (general) (routine) without abnormal findings: Secondary | ICD-10-CM | POA: Diagnosis present

## 2024-03-18 DIAGNOSIS — Z1151 Encounter for screening for human papillomavirus (HPV): Secondary | ICD-10-CM | POA: Insufficient documentation

## 2024-03-18 DIAGNOSIS — Z124 Encounter for screening for malignant neoplasm of cervix: Secondary | ICD-10-CM | POA: Insufficient documentation

## 2024-03-18 NOTE — Progress Notes (Signed)
 28 y.o. G1P1001 Single Black or [place] American female here for annual exam.    Patient's last menstrual period was 03/15/2024 (exact date).          Sexually active: Yes.    The current method of family planning is none.    Exercising: Yes.     Smoker:  no  Health Maintenance: Pap:  Collected today History of abnormal Pap:  denies   reports that she has never smoked. She has never used smokeless tobacco. She reports current drug use. Drug: Marijuana. She reports that she does not drink alcohol.   Past Surgical History:  Procedure Laterality Date   NO PAST SURGERIES      No current outpatient medications on file.   No current facility-administered medications for this visit.    Family History  Problem Relation Age of Onset   Diabetes Maternal Grandfather     ROS: Constitutional: negative Genitourinary:negative  Exam:   BP 108/69   Pulse (!) 54   Ht 5' 3 (1.6 m) Comment: Reported  Wt 123 lb 3.2 oz (55.9 kg)   LMP 03/15/2024 (Exact Date) Comment: Started the 9th  Breastfeeding No   BMI 21.82 kg/m   Height: 5' 3 (160 cm) (Reported)  General appearance: alert, cooperative and appears stated age Head: Normocephalic, without obvious abnormality, atraumatic Neck: no adenopathy, supple, symmetrical, trachea midline and thyroid normal to inspection and palpation Lungs: clear to auscultation bilaterally Breasts: normal appearance, no masses or tenderness, Inspection negative, No nipple retraction or dimpling, No nipple discharge or bleeding, No axillary or supraclavicular adenopathy, Normal to palpation without dominant masses Heart: regular rate and rhythm Abdomen: soft, non-tender; bowel sounds normal; no masses,  no organomegaly Extremities: extremities normal, atraumatic, no cyanosis or edema Skin: Skin color, texture, turgor normal. No rashes or lesions Lymph nodes: Cervical, supraclavicular, and axillary nodes normal. No abnormal inguinal nodes  palpated Neurologic: Grossly normal   Pelvic: External genitalia:  no lesions              Urethra:  normal appearing urethra with no masses, tenderness or lesions              Bartholins and Skenes: normal                 Vagina: normal appearing vagina with normal color and no discharge, no lesions              Cervix: multiparous appearance, no bleeding following Pap, and no cervical motion tenderness              Pap taken: Yes.                Anus:  normal sphincter tone, no lesions  Chaperone,  CMA, was present for exam.  Assessment/Plan:  1. Encounter for annual routine gynecological examination (Primary) - Start prenatal vitamin in preparation for future pregnancy - CBC - Comp Met (CMET) - TSH - HIV antibody (with reflex) - RPR - Hepatitis B Surface AntiGEN - Hepatitis C Antibody - Cytology - PAP( [place]) - Hemoglobin A1c - Lipid Panel With LDL/HDL Ratio - Anti mullerian hormone - Beta hCG quant (ref lab)   Arland MARLA Roller

## 2024-03-19 LAB — BETA HCG QUANT (REF LAB): hCG Quant: <1 m[IU]/mL

## 2024-03-22 DIAGNOSIS — Z01419 Encounter for gynecological examination (general) (routine) without abnormal findings: Secondary | ICD-10-CM | POA: Insufficient documentation

## 2024-03-23 LAB — CYTOLOGY - PAP
Chlamydia: NEGATIVE
Comment: NEGATIVE
Comment: NEGATIVE
Comment: NEGATIVE
Comment: NEGATIVE
Comment: NEGATIVE
Comment: NEGATIVE
Comment: NORMAL
Diagnosis: NEGATIVE
HPV 16: NEGATIVE
HPV 18 / 45: NEGATIVE
HSV1: NEGATIVE
HSV2: NEGATIVE
High risk HPV: POSITIVE — AB
Neisseria Gonorrhea: POSITIVE — AB
Trichomonas: POSITIVE — AB

## 2024-03-24 ENCOUNTER — Encounter (HOSPITAL_BASED_OUTPATIENT_CLINIC_OR_DEPARTMENT_OTHER): Payer: Self-pay | Admitting: Certified Nurse Midwife

## 2024-03-24 ENCOUNTER — Ambulatory Visit (HOSPITAL_BASED_OUTPATIENT_CLINIC_OR_DEPARTMENT_OTHER): Payer: Self-pay | Admitting: Certified Nurse Midwife

## 2024-03-24 DIAGNOSIS — A5901 Trichomonal vulvovaginitis: Secondary | ICD-10-CM | POA: Insufficient documentation

## 2024-03-24 DIAGNOSIS — B977 Papillomavirus as the cause of diseases classified elsewhere: Secondary | ICD-10-CM | POA: Insufficient documentation

## 2024-03-24 DIAGNOSIS — A549 Gonococcal infection, unspecified: Secondary | ICD-10-CM | POA: Insufficient documentation

## 2024-03-24 LAB — LIPID PANEL WITH LDL/HDL RATIO
Cholesterol, Total: 143 mg/dL (ref 100–199)
HDL: 71 mg/dL (ref >39)
LDL Chol Calc (NIH): 62 mg/dL (ref 0–99)
LDL/HDL Ratio: 0.9 ratio (ref 0.0–3.2)
Triglycerides: 45 mg/dL (ref 0–149)
VLDL Cholesterol Cal: 10 mg/dL (ref 5–40)

## 2024-03-24 LAB — TSH: TSH: 1.98 u[IU]/mL (ref 0.450–4.500)

## 2024-03-24 LAB — CBC
Hematocrit: 42.9 % (ref 34.0–46.6)
Hemoglobin: 13.7 g/dL (ref 11.1–15.9)
MCH: 30.8 pg (ref 26.6–33.0)
MCHC: 31.9 g/dL (ref 31.5–35.7)
MCV: 96 fL (ref 79–97)
Platelets: 187 x10E3/uL (ref 150–450)
RBC: 4.45 x10E6/uL (ref 3.77–5.28)
RDW: 12.5 % (ref 11.7–15.4)
WBC: 8.2 x10E3/uL (ref 3.4–10.8)

## 2024-03-24 LAB — COMPREHENSIVE METABOLIC PANEL WITH GFR
ALT: 12 IU/L (ref 0–32)
AST: 14 IU/L (ref 0–40)
Albumin: 4.8 g/dL (ref 4.0–5.0)
Alkaline Phosphatase: 52 IU/L (ref 44–121)
BUN/Creatinine Ratio: 14 (ref 9–23)
BUN: 13 mg/dL (ref 6–20)
Bilirubin Total: <0.2 mg/dL (ref 0.0–1.2)
CO2: 22 mmol/L (ref 20–29)
Calcium: 9.9 mg/dL (ref 8.7–10.2)
Chloride: 102 mmol/L (ref 96–106)
Creatinine, Ser: 0.92 mg/dL (ref 0.57–1.00)
Globulin, Total: 2.6 g/dL (ref 1.5–4.5)
Glucose: 86 mg/dL (ref 70–99)
Potassium: 4.4 mmol/L (ref 3.5–5.2)
Sodium: 140 mmol/L (ref 134–144)
Total Protein: 7.4 g/dL (ref 6.0–8.5)
eGFR: 88 mL/min/1.73 (ref >59)

## 2024-03-24 LAB — ANTI MULLERIAN HORMONE: ANTI-MULLERIAN HORMONE (AMH): 4.71 ng/mL

## 2024-03-24 LAB — HEMOGLOBIN A1C
Est. average glucose Bld gHb Est-mCnc: 103 mg/dL
Hgb A1c MFr Bld: 5.2 % (ref 4.8–5.6)

## 2024-03-24 LAB — HEPATITIS B SURFACE ANTIGEN: Hepatitis B Surface Ag: NEGATIVE

## 2024-03-24 LAB — RPR: RPR Ser Ql: NONREACTIVE

## 2024-03-24 LAB — HIV ANTIBODY (ROUTINE TESTING W REFLEX): HIV Screen 4th Generation wRfx: NONREACTIVE

## 2024-03-24 LAB — HEPATITIS C ANTIBODY: Hep C Virus Ab: NONREACTIVE

## 2024-03-24 MED ORDER — METRONIDAZOLE 500 MG PO TABS: 500.0000 mg | ORAL_TABLET | Freq: Two times a day (BID) | ORAL | 3 refills | Status: AC

## 2024-04-05 ENCOUNTER — Encounter (HOSPITAL_BASED_OUTPATIENT_CLINIC_OR_DEPARTMENT_OTHER): Payer: Self-pay | Admitting: Certified Nurse Midwife

## 2024-05-07 ENCOUNTER — Encounter (HOSPITAL_COMMUNITY): Payer: Self-pay

## 2024-05-07 ENCOUNTER — Inpatient Hospital Stay (HOSPITAL_COMMUNITY)
Admission: AD | Admit: 2024-05-07 | Discharge: 2024-05-08 | Disposition: A | Discharge status: Home / Self Care | Attending: Obstetrics and Gynecology | Admitting: Obstetrics and Gynecology

## 2024-05-07 DIAGNOSIS — Z3202 Encounter for pregnancy test, result negative: Secondary | ICD-10-CM | POA: Diagnosis not present

## 2024-05-07 DIAGNOSIS — N939 Abnormal uterine and vaginal bleeding, unspecified: Secondary | ICD-10-CM | POA: Diagnosis not present

## 2024-05-07 DIAGNOSIS — Z789 Other specified health status: Secondary | ICD-10-CM

## 2024-05-07 DIAGNOSIS — Z3201 Encounter for pregnancy test, result positive: Secondary | ICD-10-CM

## 2024-05-07 LAB — POC URINE PREG, ED: Preg Test, Ur: NEGATIVE

## 2024-05-07 NOTE — ED Triage Notes (Signed)
 Patient thinks she is possibly pregnant and she started bleeding today.   LMP: 03/15/2024

## 2024-05-08 DIAGNOSIS — N939 Abnormal uterine and vaginal bleeding, unspecified: Secondary | ICD-10-CM

## 2024-05-08 DIAGNOSIS — Z789 Other specified health status: Secondary | ICD-10-CM

## 2024-05-08 LAB — HCG, QUANTITATIVE, PREGNANCY: hCG, Beta Chain, Quant, S: 3 m[IU]/mL (ref <5)

## 2024-05-08 LAB — POCT PREGNANCY, URINE: Preg Test, Ur: NEGATIVE

## 2024-05-08 NOTE — MAU Provider Note (Signed)
 History     CSN: [id number]  Arrival date and time: 05/07/24 2135   None     No chief complaint on file.  Sydney Adams , a  28 y.o. G1P1001 at Unknown presents to MAU with complaints of vaginal bleeding. Patient states that she had 2 positive home pregnancy test on Wednesday and Thursday of this week. She states that at 1pm this afternoon she started having bright red vaginal bleeding. She states that it took almost 12 hours to saturate a pad and denies passing clots. She also denies any pain. Denies abnormal vaginal discharge or urinary symptoms. She states that this is a highly desired pregnancy.          OB History     Gravida  1   Para  1   Term  1   Preterm      AB      Living  1      SAB      IAB      Ectopic      Multiple  0   Live Births  1           Past Medical History:  Diagnosis Date   Chlamydia 07/2016    Past Surgical History:  Procedure Laterality Date   NO PAST SURGERIES      Family History  Problem Relation Age of Onset   Diabetes Maternal Grandfather     Social History   Tobacco Use   Smoking status: Never   Smokeless tobacco: Never  Vaping Use   Vaping status: Former  Substance Use Topics   Alcohol use: No    Comment: Social drinker   Drug use: Yes    Types: Marijuana    Allergies:  Allergies  Allergen Reactions   Rocephin [Ceftriaxone] Rash    Medications Prior to Admission  Medication Sig Dispense Refill Last Dose/Taking   metroNIDAZOLE  (FLAGYL ) 500 MG tablet Take 1 tablet (500 mg total) by mouth 2 (two) times daily. 14 tablet 3     Review of Systems  Constitutional:  Negative for chills, fatigue and fever.  Eyes:  Negative for pain and visual disturbance.  Respiratory:  Negative for apnea, shortness of breath and wheezing.   Cardiovascular:  Negative for chest pain and palpitations.  Gastrointestinal:  Negative for abdominal pain, constipation, diarrhea, nausea and vomiting.  Genitourinary:   Positive for vaginal bleeding. Negative for difficulty urinating, dysuria, pelvic pain, vaginal discharge and vaginal pain.  Musculoskeletal:  Negative for back pain.  Neurological:  Negative for seizures, weakness and headaches.  Psychiatric/Behavioral:  Negative for suicidal ideas.    Physical Exam   Blood pressure 126/81, pulse 64, temperature 98 F (36.7 C), temperature source Oral, resp. rate 16, height 5' 4 (1.626 m), weight 57.2 kg, last menstrual period 03/15/2024, SpO2 100%.  Physical Exam Vitals and nursing note reviewed.  Constitutional:      General: She is not in acute distress.    Appearance: Normal appearance.  HENT:     Head: Normocephalic.  Pulmonary:     Effort: Pulmonary effort is normal.  Musculoskeletal:     Cervical back: Normal range of motion.  Skin:    General: Skin is warm and dry.  Neurological:     Mental Status: She is alert and oriented to person, place, and time.  Psychiatric:        Mood and Affect: Mood normal.     MAU Course  Procedures Orders Placed This Encounter  Procedures  hCG, quantitative, pregnancy   POC Urine Pregnancy, ED (not at [facility] or [facility])   Pregnancy, urine POC   Discharge patient Discharge disposition: 01-Home or Self Care; Discharge patient date: 05/08/2024   Results for orders placed or performed during the hospital encounter of 05/07/24 (from the past 48 hours)  POC Urine Pregnancy, ED (not at [facility] or [facility])     Status: None   Collection Time: 05/07/24 10:27 PM  Result Value Ref Range   Preg Test, Ur NEGATIVE NEGATIVE    Comment:        THE SENSITIVITY OF THIS METHODOLOGY IS >20 mIU/mL.   Pregnancy, urine POC     Status: None   Collection Time: 05/08/24  1:08 AM  Result Value Ref Range   Preg Test, Ur NEGATIVE NEGATIVE    Comment:        THE SENSITIVITY OF THIS METHODOLOGY IS >20 mIU/mL.   hCG, quantitative, pregnancy     Status: None   Collection Time: 05/08/24  2:39 AM  Result Value Ref Range   hCG, Beta  Chain, Quant, S 3 <5 mIU/mL    Comment:          GEST. AGE      CONC.  (mIU/mL)   <=1 WEEK        5 - 50     2 WEEKS       50 - 500     3 WEEKS       100 - 10,000     4 WEEKS     1,000 - 30,000     5 WEEKS     3,500 - 115,000   6-8 WEEKS     12,000 - 270,000    12 WEEKS     15,000 - 220,000        FEMALE AND NON-PREGNANT FEMALE:     LESS THAN 5 mIU/mL Performed at [facility], [address], [place], [place] [address]      MDM - Negative UPT on arrival to MAU  - HCG collected and patient desired to stay to await results.  - HCG levels are 3 and indicative of not pregnant.  - plan for discharge.   Assessment and Plan   1. Vaginal bleeding   2. Not currently pregnant   3. Positive pregnancy test    - Reviewed that her pregnancy test was negative in MAU.  - Discussed results and answered all questions regarding quants in pregnancy.  - Reviewed worsening signs and return precautions.  - Since TTC, information provided on diety and lifestyle to help achieve pregnancy.  - Patient discharged home in stable condition and may return to MAU as needed.   [name], MSN CNM  05/08/2024, 4:14 AM

## 2024-05-08 NOTE — MAU Note (Signed)
 Sydney Adams is a 28 y.o. at Unknown here in MAU reporting: 2 positive pregnancy test at home. Started having VB today at 1300 - was light spotting then got heavier. Reports pad was saturated when using the restroom here in MAU, but it took all day for it to get that way. Denies pain. Last intercourse was last night.   LMP: 03/15/2024 Onset of complaint: 1300 Pain score: 0 Vitals:   05/07/24 2205 05/08/24 0100  BP:  126/81  Pulse:  64  Resp:  16  Temp:  98 F (36.7 C)  SpO2: 100% 100%     FHT: NA  Lab orders placed from triage: none

## 2024-06-09 ENCOUNTER — Ambulatory Visit (INDEPENDENT_AMBULATORY_CARE_PROVIDER_SITE_OTHER)

## 2024-06-09 ENCOUNTER — Encounter (HOSPITAL_BASED_OUTPATIENT_CLINIC_OR_DEPARTMENT_OTHER): Payer: Self-pay

## 2024-06-09 VITALS — BP 121/76 | HR 79 | Ht 63.0 in | Wt 128.8 lb

## 2024-06-09 DIAGNOSIS — Z3201 Encounter for pregnancy test, result positive: Secondary | ICD-10-CM

## 2024-06-09 DIAGNOSIS — N912 Amenorrhea, unspecified: Secondary | ICD-10-CM

## 2024-06-09 DIAGNOSIS — Z3A01 Less than 8 weeks gestation of pregnancy: Secondary | ICD-10-CM

## 2024-06-09 DIAGNOSIS — Z3200 Encounter for pregnancy test, result unknown: Secondary | ICD-10-CM

## 2024-06-09 DIAGNOSIS — Z3480 Encounter for supervision of other normal pregnancy, unspecified trimester: Secondary | ICD-10-CM | POA: Insufficient documentation

## 2024-06-09 DIAGNOSIS — O3680X0 Pregnancy with inconclusive fetal viability, not applicable or unspecified: Secondary | ICD-10-CM

## 2024-06-09 LAB — POCT URINE PREGNANCY: Preg Test, Ur: POSITIVE — AB

## 2024-06-09 MED ORDER — PRENATAL 27-1 MG PO TABS: 1.0000 | ORAL_TABLET | Freq: Every day | ORAL | 11 refills | Status: AC

## 2024-06-09 NOTE — Progress Notes (Cosign Needed Addendum)
 NURSE VISIT- PREGNANCY CONFIRMATION   SUBJECTIVE:  Sydney Adams is a 28 y.o. G41P1001 female at [redacted]w[redacted]d by certain LMP. Patient's last menstrual period was 05/07/2024 (exact date). Here for pregnancy confirmation.  Home pregnancy test: positive x 5  She reports no complaints.  She is taking prenatal vitamins.    I explained I am completing New OB Intake today. We discussed EDD of 02/11/2025 based on LMP of 05/07/2024. Pt is G2P1001. I reviewed her allergies, medications and Medical/Surgical/OB history.    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 I explained pt will have some visits in office and some virtually. [employer]s instructions given and order placed. Patient verifies receipt of registration text/e-mail. Account successfully created and app downloaded.  Blood Pressure Cuff/Weight Scale Patient has private insurance; instructed to purchase blood pressure cuff and bring to first prenatal appt. Explained after first prenatal appt pt will check weekly and document in Babys[employer]s. Patient does have weight scale.  Is patient a candidate for Babys[employer]s Optimization? Yes, patient accepted    Last Pap Diagnosis  Date Value Ref Range Status  03/18/2024   Final   - Negative for intraepithelial lesion or malignancy (NILM)    OBJECTIVE:  BP 121/76 (BP Location: Right Arm, Patient Position: Sitting, Cuff Size: Normal)   Pulse 79   Ht 5' 3 (1.6 m)   Wt 128 lb 12.8 oz (58.4 kg)   LMP 05/07/2024 (Exact Date)   SpO2 99%   BMI 22.82 kg/m   Appears well, in no apparent distress  Results for orders placed or  performed in visit on 06/09/24 (from the past 24 hours)  POCT urine pregnancy   Collection Time: 06/09/24 10:33 AM  Result Value Ref Range   Preg Test, Ur Positive (A) Negative    ASSESSMENT: Positive pregnancy test. LMP 05/07/2024. EDD 02/11/2025.    PLAN: Prenatal vitamins: Prenatal 27-1 mg take 1 tablet daily #30 11RF sent to pharmacy on file.   Nausea medicines: not currently needed Viability scan ordered. Viability scan scheduled for 07/13/2024 at 3:30 pm. New OB packet provided and reviewed. Advised to return completed paperwork at her new OB appointment. New OB appointment scheduled for 07/19/2024 at 10:55 am with Nidia Daring, NP.   Jesaiah Fabiano E, RN 06/09/2024  10:33 AM

## 2024-06-20 ENCOUNTER — Ambulatory Visit (HOSPITAL_BASED_OUTPATIENT_CLINIC_OR_DEPARTMENT_OTHER): Admitting: Certified Nurse Midwife

## 2024-06-20 ENCOUNTER — Encounter (HOSPITAL_BASED_OUTPATIENT_CLINIC_OR_DEPARTMENT_OTHER): Payer: Self-pay

## 2024-06-20 VITALS — BP 121/80 | HR 57 | Wt 127.0 lb

## 2024-06-20 DIAGNOSIS — O26851 Spotting complicating pregnancy, first trimester: Secondary | ICD-10-CM

## 2024-06-20 DIAGNOSIS — Z3A01 Less than 8 weeks gestation of pregnancy: Secondary | ICD-10-CM

## 2024-06-20 DIAGNOSIS — O209 Hemorrhage in early pregnancy, unspecified: Secondary | ICD-10-CM

## 2024-06-20 NOTE — Progress Notes (Signed)
 Subjective:     Malaysia Crance is a 28 y.o. female at approx [redacted] weeks pregnant here due to some vaginal spotting which seems to have resolved. She denies fever, chills, abdominal or pelvic pain. No urinary or vaginal complaints. Pt reports hx of one prior miscarriage.   The following portions of the patient's history were reviewed and updated as appropriate: allergies, current medications, past family history, past medical history, past social history, past surgical history, and problem list.   Review of Systems Pertinent items are noted in HPI.    Objective:    BP 121/80   Pulse (!) 57   Wt 127 lb (57.6 kg)   LMP 05/07/2024 (Exact Date)   BMI 22.50 kg/m  General appearance: alert, cooperative, appears stated age, and no distress    Assessment:    1st Trimester Spotting .    Plan:    Discussed options with patient. Will check Quant HCG today and repeat in 48 hours to ensure Quant HCG increasing adequately. RTO as scheduled for ultrasound to confirm dating/viability. Continue prenatal vitamins  Vayden Weinand K Kazue Cerro

## 2024-06-21 ENCOUNTER — Encounter (HOSPITAL_BASED_OUTPATIENT_CLINIC_OR_DEPARTMENT_OTHER): Payer: Self-pay | Admitting: Certified Nurse Midwife

## 2024-06-21 LAB — BETA HCG QUANT (REF LAB): hCG Quant: 4 m[IU]/mL

## 2024-06-22 ENCOUNTER — Ambulatory Visit (HOSPITAL_BASED_OUTPATIENT_CLINIC_OR_DEPARTMENT_OTHER): Payer: Self-pay | Admitting: Certified Nurse Midwife

## 2024-07-11 ENCOUNTER — Encounter (HOSPITAL_BASED_OUTPATIENT_CLINIC_OR_DEPARTMENT_OTHER): Payer: Self-pay | Admitting: Certified Nurse Midwife

## 2024-07-13 ENCOUNTER — Other Ambulatory Visit (HOSPITAL_BASED_OUTPATIENT_CLINIC_OR_DEPARTMENT_OTHER)

## 2024-07-19 ENCOUNTER — Encounter (HOSPITAL_BASED_OUTPATIENT_CLINIC_OR_DEPARTMENT_OTHER): Admitting: Obstetrics and Gynecology
